# Patient Record
Sex: Male | Born: 1965 | Race: White | Hispanic: No | Marital: Married | Smoking: Never smoker
Health system: Southern US, Community
[De-identification: ages and names within clinical notes are randomized; demographics above are authoritative.]

## PROBLEM LIST (undated history)

## (undated) DIAGNOSIS — C801 Malignant (primary) neoplasm, unspecified: Secondary | ICD-10-CM

## (undated) HISTORY — PX: OTHER SURGICAL HISTORY: SHX169

---

## 2006-02-17 ENCOUNTER — Encounter: Admission: RE | Admit: 2006-02-17 | Discharge: 2006-02-17 | Payer: Self-pay | Admitting: Family Medicine

## 2006-02-19 ENCOUNTER — Ambulatory Visit (HOSPITAL_BASED_OUTPATIENT_CLINIC_OR_DEPARTMENT_OTHER): Admission: RE | Admit: 2006-02-19 | Discharge: 2006-02-19 | Payer: Self-pay | Admitting: Urology

## 2006-02-19 ENCOUNTER — Encounter (INDEPENDENT_AMBULATORY_CARE_PROVIDER_SITE_OTHER): Payer: Self-pay | Admitting: *Deleted

## 2006-03-09 ENCOUNTER — Ambulatory Visit: Payer: Self-pay | Admitting: Oncology

## 2006-04-01 LAB — CBC WITH DIFFERENTIAL/PLATELET
BASO%: 0.6 % (ref 0.0–2.0)
EOS%: 2.3 % (ref 0.0–7.0)
HCT: 48.7 % (ref 38.7–49.9)
LYMPH%: 35.8 % (ref 14.0–48.0)
MCH: 31.9 pg (ref 28.0–33.4)
MCHC: 35.8 g/dL (ref 32.0–35.9)
MONO%: 11.7 % (ref 0.0–13.0)
NEUT%: 49.6 % (ref 40.0–75.0)
Platelets: 159 10*3/uL (ref 145–400)

## 2006-04-02 ENCOUNTER — Ambulatory Visit (HOSPITAL_COMMUNITY): Admission: RE | Admit: 2006-04-02 | Discharge: 2006-04-02 | Payer: Self-pay | Admitting: Oncology

## 2006-04-04 LAB — COMPREHENSIVE METABOLIC PANEL
ALT: 34 U/L (ref 0–53)
AST: 21 U/L (ref 0–37)
Alkaline Phosphatase: 77 U/L (ref 39–117)
Creatinine, Ser: 1.22 mg/dL (ref 0.40–1.50)
Total Bilirubin: 1 mg/dL (ref 0.3–1.2)

## 2006-04-04 LAB — AFP TUMOR MARKER: AFP-Tumor Marker: 3.9 ng/mL (ref 0.0–8.0)

## 2006-04-07 ENCOUNTER — Ambulatory Visit: Admission: RE | Admit: 2006-04-07 | Discharge: 2006-04-07 | Payer: Self-pay | Admitting: Oncology

## 2006-04-14 LAB — COMPREHENSIVE METABOLIC PANEL
AST: 27 U/L (ref 0–37)
Albumin: 4.1 g/dL (ref 3.5–5.2)
Alkaline Phosphatase: 71 U/L (ref 39–117)
BUN: 11 mg/dL (ref 6–23)
Creatinine, Ser: 0.95 mg/dL (ref 0.40–1.50)
Glucose, Bld: 101 mg/dL — ABNORMAL HIGH (ref 70–99)
Total Bilirubin: 0.9 mg/dL (ref 0.3–1.2)

## 2006-04-14 LAB — CBC WITH DIFFERENTIAL/PLATELET
Basophils Absolute: 0 10*3/uL (ref 0.0–0.1)
EOS%: 2.7 % (ref 0.0–7.0)
Eosinophils Absolute: 0.1 10*3/uL (ref 0.0–0.5)
HGB: 17.1 g/dL (ref 13.0–17.1)
LYMPH%: 30 % (ref 14.0–48.0)
MCH: 31.6 pg (ref 28.0–33.4)
MCV: 87.7 fL (ref 81.6–98.0)
MONO%: 12.7 % (ref 0.0–13.0)
NEUT#: 2 10*3/uL (ref 1.5–6.5)
NEUT%: 53.8 % (ref 40.0–75.0)
Platelets: 176 10*3/uL (ref 145–400)

## 2006-04-22 LAB — CBC WITH DIFFERENTIAL/PLATELET
Basophils Absolute: 0 10*3/uL (ref 0.0–0.1)
Eosinophils Absolute: 0 10*3/uL (ref 0.0–0.5)
HCT: 44.6 % (ref 38.7–49.9)
HGB: 16.3 g/dL (ref 13.0–17.1)
MCV: 85 fL (ref 81.6–98.0)
MONO%: 0.3 % (ref 0.0–13.0)
NEUT#: 3.7 10*3/uL (ref 1.5–6.5)
NEUT%: 85.9 % — ABNORMAL HIGH (ref 40.0–75.0)
Platelets: 161 10*3/uL (ref 145–400)
RDW: 9.8 % — ABNORMAL LOW (ref 11.2–14.6)

## 2006-04-24 ENCOUNTER — Ambulatory Visit: Payer: Self-pay | Admitting: Oncology

## 2006-04-29 LAB — CBC WITH DIFFERENTIAL/PLATELET
BASO%: 1.9 % (ref 0.0–2.0)
EOS%: 2.7 % (ref 0.0–7.0)
HCT: UNDETERMINED % (ref 38.7–49.9)
HGB: 14.2 g/dL (ref 13.0–17.1)
MCH: UNDETERMINED pg (ref 28.0–33.4)
MCHC: UNDETERMINED g/dL (ref 32.0–35.9)
MONO#: 0.2 10*3/uL (ref 0.1–0.9)
NEUT%: 2.4 % — ABNORMAL LOW (ref 40.0–75.0)
RDW: UNDETERMINED % (ref 11.2–14.6)
WBC: 1 10*3/uL — ABNORMAL LOW (ref 4.0–10.0)
lymph#: 0.7 10*3/uL — ABNORMAL LOW (ref 0.9–3.3)

## 2006-05-02 LAB — CBC WITH DIFFERENTIAL/PLATELET
Basophils Absolute: 0 10*3/uL (ref 0.0–0.1)
Eosinophils Absolute: 0.1 10*3/uL (ref 0.0–0.5)
HCT: 41.1 % (ref 38.7–49.9)
HGB: 14.8 g/dL (ref 13.0–17.1)
MONO#: 1.6 10*3/uL — ABNORMAL HIGH (ref 0.1–0.9)
NEUT#: 10.8 10*3/uL — ABNORMAL HIGH (ref 1.5–6.5)
NEUT%: 80 % — ABNORMAL HIGH (ref 40.0–75.0)
RDW: 12.3 % (ref 11.2–14.6)
lymph#: 0.9 10*3/uL (ref 0.9–3.3)

## 2006-05-04 LAB — AFP TUMOR MARKER: AFP-Tumor Marker: 3.8 ng/mL (ref 0.0–8.0)

## 2006-05-04 LAB — COMPREHENSIVE METABOLIC PANEL
AST: 39 U/L — ABNORMAL HIGH (ref 0–37)
Albumin: 4 g/dL (ref 3.5–5.2)
BUN: 19 mg/dL (ref 6–23)
CO2: 25 mEq/L (ref 19–32)
Calcium: 8.9 mg/dL (ref 8.4–10.5)
Chloride: 100 mEq/L (ref 96–112)
Creatinine, Ser: 1.04 mg/dL (ref 0.40–1.50)
Glucose, Bld: 212 mg/dL — ABNORMAL HIGH (ref 70–99)
Potassium: 3.8 mEq/L (ref 3.5–5.3)

## 2006-05-04 LAB — LACTATE DEHYDROGENASE: LDH: 1113 U/L — ABNORMAL HIGH (ref 94–250)

## 2006-05-13 LAB — CBC WITH DIFFERENTIAL/PLATELET
BASO%: 0.4 % (ref 0.0–2.0)
EOS%: 0.3 % (ref 0.0–7.0)
LYMPH%: 10.4 % — ABNORMAL LOW (ref 14.0–48.0)
MCH: 30.8 pg (ref 28.0–33.4)
MCHC: 36.3 g/dL — ABNORMAL HIGH (ref 32.0–35.9)
MONO#: 0 10*3/uL — ABNORMAL LOW (ref 0.1–0.9)
MONO%: 1 % (ref 0.0–13.0)
NEUT%: 87.9 % — ABNORMAL HIGH (ref 40.0–75.0)
Platelets: 57 10*3/uL — ABNORMAL LOW (ref 145–400)
RBC: 4.37 10*6/uL (ref 4.20–5.71)
WBC: 4.8 10*3/uL (ref 4.0–10.0)

## 2006-05-20 LAB — CBC WITH DIFFERENTIAL/PLATELET
BASO%: 1.9 % (ref 0.0–2.0)
EOS%: 0.7 % (ref 0.0–7.0)
HCT: 32.6 % — ABNORMAL LOW (ref 38.7–49.9)
MCH: 30.6 pg (ref 28.0–33.4)
MCHC: 35.6 g/dL (ref 32.0–35.9)
MONO#: 0.7 10*3/uL (ref 0.1–0.9)
RBC: 3.79 10*6/uL — ABNORMAL LOW (ref 4.20–5.71)
RDW: 11.1 % — ABNORMAL LOW (ref 11.2–14.6)
WBC: 15.6 10*3/uL — ABNORMAL HIGH (ref 4.0–10.0)
lymph#: 1.2 10*3/uL (ref 0.9–3.3)

## 2006-05-23 LAB — CBC WITH DIFFERENTIAL/PLATELET
BASO%: 0.1 % (ref 0.0–2.0)
Basophils Absolute: 0 10*3/uL (ref 0.0–0.1)
EOS%: 0.6 % (ref 0.0–7.0)
HCT: 32.6 % — ABNORMAL LOW (ref 38.7–49.9)
HGB: 11.7 g/dL — ABNORMAL LOW (ref 13.0–17.1)
MCH: 31.2 pg (ref 28.0–33.4)
MONO#: 0.6 10*3/uL (ref 0.1–0.9)
NEUT%: 91.1 % — ABNORMAL HIGH (ref 40.0–75.0)
RDW: 12.2 % (ref 11.2–14.6)
WBC: 18.5 10*3/uL — ABNORMAL HIGH (ref 4.0–10.0)
lymph#: 0.9 10*3/uL (ref 0.9–3.3)

## 2006-05-25 LAB — BETA HCG QUANT (REF LAB): Beta hCG, Tumor Marker: <0.5 m[IU]/mL (ref <5.0)

## 2006-05-25 LAB — LACTATE DEHYDROGENASE: LDH: 612 U/L — ABNORMAL HIGH (ref 94–250)

## 2006-05-25 LAB — AFP TUMOR MARKER: AFP-Tumor Marker: 4.8 ng/mL (ref 0.0–8.0)

## 2006-06-03 ENCOUNTER — Emergency Department (HOSPITAL_COMMUNITY): Admission: EM | Admit: 2006-06-03 | Discharge: 2006-06-03 | Payer: Self-pay | Admitting: Emergency Medicine

## 2006-06-03 ENCOUNTER — Ambulatory Visit: Payer: Self-pay | Admitting: Oncology

## 2006-06-03 LAB — CBC WITH DIFFERENTIAL/PLATELET
Eosinophils Absolute: 0 10*3/uL (ref 0.0–0.5)
MONO#: 0 10*3/uL — ABNORMAL LOW (ref 0.1–0.9)
NEUT#: 3.1 10*3/uL (ref 1.5–6.5)
RBC: 3.8 10*6/uL — ABNORMAL LOW (ref 4.20–5.71)
RDW: 12.6 % (ref 11.2–14.6)
WBC: 3.6 10*3/uL — ABNORMAL LOW (ref 4.0–10.0)
lymph#: 0.4 10*3/uL — ABNORMAL LOW (ref 0.9–3.3)

## 2006-06-10 ENCOUNTER — Ambulatory Visit: Payer: Self-pay | Admitting: Oncology

## 2006-06-10 LAB — CBC WITH DIFFERENTIAL/PLATELET
Eosinophils Absolute: 0 10*3/uL (ref 0.0–0.5)
HCT: 27.1 % — ABNORMAL LOW (ref 38.7–49.9)
HGB: 9.9 g/dL — ABNORMAL LOW (ref 13.0–17.1)
LYMPH%: 38 % (ref 14.0–48.0)
MONO#: 0.1 10*3/uL (ref 0.1–0.9)
NEUT#: 0.4 10*3/uL — CL (ref 1.5–6.5)
NEUT%: 42.6 % (ref 40.0–75.0)
Platelets: 62 10*3/uL — ABNORMAL LOW (ref 145–400)
RBC: 3.08 10*6/uL — ABNORMAL LOW (ref 4.20–5.71)
WBC: 1 10*3/uL — ABNORMAL LOW (ref 4.0–10.0)

## 2006-06-23 ENCOUNTER — Ambulatory Visit (HOSPITAL_COMMUNITY): Admission: RE | Admit: 2006-06-23 | Discharge: 2006-06-23 | Payer: Self-pay | Admitting: Oncology

## 2006-06-27 LAB — CBC WITH DIFFERENTIAL/PLATELET
BASO%: 0.9 % (ref 0.0–2.0)
HCT: 30.5 % — ABNORMAL LOW (ref 38.7–49.9)
LYMPH%: 24.1 % (ref 14.0–48.0)
MCHC: 35 g/dL (ref 32.0–35.9)
MONO#: 0.2 10*3/uL (ref 0.1–0.9)
NEUT%: 65.7 % (ref 40.0–75.0)
Platelets: 71 10*3/uL — ABNORMAL LOW (ref 145–400)
WBC: 2.2 10*3/uL — ABNORMAL LOW (ref 4.0–10.0)

## 2006-06-29 LAB — COMPREHENSIVE METABOLIC PANEL
ALT: 53 U/L (ref 0–53)
AST: 31 U/L (ref 0–37)
Albumin: 3.7 g/dL (ref 3.5–5.2)
BUN: 13 mg/dL (ref 6–23)
Calcium: 8.4 mg/dL (ref 8.4–10.5)
Chloride: 105 mEq/L (ref 96–112)
Potassium: 4.1 mEq/L (ref 3.5–5.3)

## 2006-06-29 LAB — BETA HCG QUANT (REF LAB): Beta hCG, Tumor Marker: <0.5 m[IU]/mL (ref <5.0)

## 2006-09-17 ENCOUNTER — Ambulatory Visit: Payer: Self-pay | Admitting: Oncology

## 2006-09-19 ENCOUNTER — Ambulatory Visit (HOSPITAL_COMMUNITY): Admission: RE | Admit: 2006-09-19 | Discharge: 2006-09-19 | Payer: Self-pay | Admitting: Oncology

## 2006-09-19 LAB — CBC WITH DIFFERENTIAL/PLATELET
BASO%: 0.6 % (ref 0.0–2.0)
EOS%: 3.3 % (ref 0.0–7.0)
HCT: 40 % (ref 38.7–49.9)
LYMPH%: 35.6 % (ref 14.0–48.0)
MCH: 31.6 pg (ref 28.0–33.4)
MCHC: 35.9 g/dL (ref 32.0–35.9)
MCV: 88.2 fL (ref 81.6–98.0)
MONO%: 15.1 % — ABNORMAL HIGH (ref 0.0–13.0)
NEUT%: 45.4 % (ref 40.0–75.0)
Platelets: 122 10*3/uL — ABNORMAL LOW (ref 145–400)

## 2006-09-22 LAB — COMPREHENSIVE METABOLIC PANEL
ALT: 38 U/L (ref 0–53)
AST: 25 U/L (ref 0–37)
Creatinine, Ser: 1.12 mg/dL (ref 0.40–1.50)
Total Bilirubin: 0.5 mg/dL (ref 0.3–1.2)

## 2006-09-22 LAB — AFP TUMOR MARKER: AFP-Tumor Marker: 3.8 ng/mL (ref 0.0–8.0)

## 2006-09-22 LAB — BETA HCG QUANT (REF LAB): Beta hCG, Tumor Marker: <0.5 m[IU]/mL (ref <5.0)

## 2006-12-24 ENCOUNTER — Ambulatory Visit: Payer: Self-pay | Admitting: Oncology

## 2006-12-26 ENCOUNTER — Ambulatory Visit (HOSPITAL_COMMUNITY): Admission: RE | Admit: 2006-12-26 | Discharge: 2006-12-26 | Payer: Self-pay | Admitting: Oncology

## 2006-12-26 LAB — CBC WITH DIFFERENTIAL/PLATELET
BASO%: 0.1 % (ref 0.0–2.0)
Basophils Absolute: 0 10*3/uL (ref 0.0–0.1)
EOS%: 1.9 % (ref 0.0–7.0)
HGB: 16.2 g/dL (ref 13.0–17.1)
MCH: 31.7 pg (ref 28.0–33.4)
MONO#: 0.3 10*3/uL (ref 0.1–0.9)
RDW: 13.1 % (ref 11.2–14.6)
WBC: 2.4 10*3/uL — ABNORMAL LOW (ref 4.0–10.0)
lymph#: 0.7 10*3/uL — ABNORMAL LOW (ref 0.9–3.3)

## 2006-12-28 LAB — COMPREHENSIVE METABOLIC PANEL
ALT: 32 U/L (ref 0–53)
AST: 19 U/L (ref 0–37)
Albumin: 4.4 g/dL (ref 3.5–5.2)
Calcium: 9 mg/dL (ref 8.4–10.5)
Chloride: 105 mEq/L (ref 96–112)
Potassium: 4.4 mEq/L (ref 3.5–5.3)

## 2006-12-28 LAB — LACTATE DEHYDROGENASE: LDH: 154 U/L (ref 94–250)

## 2007-04-01 ENCOUNTER — Ambulatory Visit: Payer: Self-pay | Admitting: Oncology

## 2007-04-03 LAB — CBC WITH DIFFERENTIAL/PLATELET
BASO%: 0.2 % (ref 0.0–2.0)
Eosinophils Absolute: 0.1 10*3/uL (ref 0.0–0.5)
LYMPH%: 38 % (ref 14.0–48.0)
MCHC: 34.8 g/dL (ref 32.0–35.9)
MCV: 89 fL (ref 81.6–98.0)
MONO%: 9.8 % (ref 0.0–13.0)
Platelets: 154 10*3/uL (ref 145–400)
RBC: 5.21 10*6/uL (ref 4.20–5.71)

## 2007-04-05 LAB — COMPREHENSIVE METABOLIC PANEL
Alkaline Phosphatase: 75 U/L (ref 39–117)
Creatinine, Ser: 1.2 mg/dL (ref 0.40–1.50)
Glucose, Bld: 87 mg/dL (ref 70–99)
Sodium: 140 mEq/L (ref 135–145)
Total Bilirubin: 0.9 mg/dL (ref 0.3–1.2)
Total Protein: 6.9 g/dL (ref 6.0–8.3)

## 2007-04-05 LAB — AFP TUMOR MARKER: AFP-Tumor Marker: 2.2 ng/mL (ref 0.0–8.0)

## 2007-07-08 ENCOUNTER — Ambulatory Visit: Payer: Self-pay | Admitting: Oncology

## 2007-07-13 ENCOUNTER — Ambulatory Visit (HOSPITAL_COMMUNITY): Admission: RE | Admit: 2007-07-13 | Discharge: 2007-07-13 | Payer: Self-pay | Admitting: Oncology

## 2007-07-13 LAB — CBC WITH DIFFERENTIAL/PLATELET
BASO%: 0 % (ref 0.0–2.0)
Eosinophils Absolute: 0 10*3/uL (ref 0.0–0.5)
MCHC: 35.4 g/dL (ref 32.0–35.9)
MONO#: 0.3 10*3/uL (ref 0.1–0.9)
NEUT#: 1.5 10*3/uL (ref 1.5–6.5)
Platelets: 132 10*3/uL — ABNORMAL LOW (ref 145–400)
RBC: 5.3 10*6/uL (ref 4.20–5.71)
RDW: 12.6 % (ref 11.2–14.6)
WBC: 2.8 10*3/uL — ABNORMAL LOW (ref 4.0–10.0)
lymph#: 0.9 10*3/uL (ref 0.9–3.3)

## 2007-07-16 LAB — COMPREHENSIVE METABOLIC PANEL
ALT: 41 U/L (ref 0–53)
Albumin: 4.6 g/dL (ref 3.5–5.2)
CO2: 25 mEq/L (ref 19–32)
Chloride: 105 mEq/L (ref 96–112)
Glucose, Bld: 105 mg/dL — ABNORMAL HIGH (ref 70–99)
Potassium: 4.2 mEq/L (ref 3.5–5.3)
Sodium: 140 mEq/L (ref 135–145)
Total Protein: 6.9 g/dL (ref 6.0–8.3)

## 2007-07-16 LAB — LACTATE DEHYDROGENASE: LDH: 171 U/L (ref 94–250)

## 2007-10-13 ENCOUNTER — Ambulatory Visit: Payer: Self-pay | Admitting: Oncology

## 2007-10-15 LAB — CBC WITH DIFFERENTIAL/PLATELET
Basophils Absolute: 0 10*3/uL (ref 0.0–0.1)
HCT: 48.2 % (ref 38.7–49.9)
HGB: 16.8 g/dL (ref 13.0–17.1)
MCH: 32.3 pg (ref 28.0–33.4)
MONO#: 0.4 10*3/uL (ref 0.1–0.9)
NEUT%: 66.2 % (ref 40.0–75.0)
Platelets: 141 10*3/uL — ABNORMAL LOW (ref 145–400)
WBC: 4 10*3/uL (ref 4.0–10.0)
lymph#: 0.9 10*3/uL (ref 0.9–3.3)

## 2007-10-17 LAB — COMPREHENSIVE METABOLIC PANEL
ALT: 28 U/L (ref 0–53)
BUN: 21 mg/dL (ref 6–23)
CO2: 23 mEq/L (ref 19–32)
Calcium: 9 mg/dL (ref 8.4–10.5)
Creatinine, Ser: 1.17 mg/dL (ref 0.40–1.50)
Total Bilirubin: 0.6 mg/dL (ref 0.3–1.2)

## 2007-10-17 LAB — LACTATE DEHYDROGENASE: LDH: 169 U/L (ref 94–250)

## 2007-10-17 LAB — AFP TUMOR MARKER: AFP-Tumor Marker: 3.2 ng/mL (ref 0.0–8.0)

## 2007-10-17 LAB — BETA HCG QUANT (REF LAB): Beta hCG, Tumor Marker: <0.5 m[IU]/mL (ref <5.0)

## 2008-01-14 ENCOUNTER — Ambulatory Visit: Payer: Self-pay | Admitting: Oncology

## 2008-01-18 ENCOUNTER — Ambulatory Visit (HOSPITAL_COMMUNITY): Admission: RE | Admit: 2008-01-18 | Discharge: 2008-01-18 | Payer: Self-pay | Admitting: Oncology

## 2008-01-18 LAB — CBC WITH DIFFERENTIAL/PLATELET
BASO%: 0.5 % (ref 0.0–2.0)
HCT: 49.4 % (ref 38.7–49.9)
LYMPH%: 31.8 % (ref 14.0–48.0)
MCHC: 35.3 g/dL (ref 32.0–35.9)
MCV: 90 fL (ref 81.6–98.0)
MONO#: 0.4 10*3/uL (ref 0.1–0.9)
MONO%: 10.6 % (ref 0.0–13.0)
NEUT%: 55.2 % (ref 40.0–75.0)
Platelets: 128 10*3/uL — ABNORMAL LOW (ref 145–400)
WBC: 3.6 10*3/uL — ABNORMAL LOW (ref 4.0–10.0)

## 2008-01-18 LAB — COMPREHENSIVE METABOLIC PANEL
ALT: 45 U/L (ref 0–53)
AST: 29 U/L (ref 0–37)
Albumin: 4.2 g/dL (ref 3.5–5.2)
Alkaline Phosphatase: 69 U/L (ref 39–117)
BUN: 15 mg/dL (ref 6–23)
Potassium: 4.1 mEq/L (ref 3.5–5.3)

## 2008-01-20 LAB — BETA HCG QUANT (REF LAB): Beta hCG, Tumor Marker: <0.5 m[IU]/mL (ref <5.0)

## 2008-05-18 ENCOUNTER — Ambulatory Visit: Payer: Self-pay | Admitting: Oncology

## 2008-05-20 ENCOUNTER — Ambulatory Visit (HOSPITAL_COMMUNITY): Admission: RE | Admit: 2008-05-20 | Discharge: 2008-05-20 | Payer: Self-pay | Admitting: Oncology

## 2008-05-20 LAB — CBC WITH DIFFERENTIAL/PLATELET
BASO%: 0.4 % (ref 0.0–2.0)
EOS%: 1.8 % (ref 0.0–7.0)
MCH: 31.8 pg (ref 27.2–33.4)
MCHC: 35.2 g/dL (ref 32.0–36.0)
MONO#: 0.3 10*3/uL (ref 0.1–0.9)
RBC: 5.23 10*6/uL (ref 4.20–5.82)
RDW: 12.5 % (ref 11.0–14.6)
WBC: 3.2 10*3/uL — ABNORMAL LOW (ref 4.0–10.3)
lymph#: 0.8 10*3/uL — ABNORMAL LOW (ref 0.9–3.3)

## 2008-05-24 LAB — COMPREHENSIVE METABOLIC PANEL
ALT: 37 U/L (ref 0–53)
AST: 19 U/L (ref 0–37)
Albumin: 4.4 g/dL (ref 3.5–5.2)
CO2: 26 mEq/L (ref 19–32)
Calcium: 9.2 mg/dL (ref 8.4–10.5)
Chloride: 105 mEq/L (ref 96–112)
Creatinine, Ser: 1.11 mg/dL (ref 0.40–1.50)
Potassium: 4 mEq/L (ref 3.5–5.3)
Sodium: 138 mEq/L (ref 135–145)
Total Protein: 6.6 g/dL (ref 6.0–8.3)

## 2008-09-16 ENCOUNTER — Ambulatory Visit: Payer: Self-pay | Admitting: Oncology

## 2008-09-20 ENCOUNTER — Ambulatory Visit (HOSPITAL_COMMUNITY): Admission: RE | Admit: 2008-09-20 | Discharge: 2008-09-20 | Payer: Self-pay | Admitting: Oncology

## 2008-09-20 LAB — COMPREHENSIVE METABOLIC PANEL
Albumin: 4.3 g/dL (ref 3.5–5.2)
BUN: 12 mg/dL (ref 6–23)
Calcium: 9.2 mg/dL (ref 8.4–10.5)
Chloride: 104 mEq/L (ref 96–112)
Creatinine, Ser: 1.1 mg/dL (ref 0.40–1.50)
Glucose, Bld: 110 mg/dL — ABNORMAL HIGH (ref 70–99)
Potassium: 4.3 mEq/L (ref 3.5–5.3)

## 2008-09-20 LAB — CBC WITH DIFFERENTIAL/PLATELET
Basophils Absolute: 0 10*3/uL (ref 0.0–0.1)
EOS%: 2.6 % (ref 0.0–7.0)
Eosinophils Absolute: 0.1 10*3/uL (ref 0.0–0.5)
HCT: 48.1 % (ref 38.4–49.9)
HGB: 17.1 g/dL (ref 13.0–17.1)
MCH: 32.5 pg (ref 27.2–33.4)
MCV: 91.5 fL (ref 79.3–98.0)
MONO%: 10.9 % (ref 0.0–14.0)
NEUT#: 1.7 10*3/uL (ref 1.5–6.5)
NEUT%: 56 % (ref 39.0–75.0)
RDW: 12.7 % (ref 11.0–14.6)
lymph#: 0.9 10*3/uL (ref 0.9–3.3)

## 2008-09-22 LAB — AFP TUMOR MARKER: AFP-Tumor Marker: 4.1 ng/mL (ref 0.0–8.0)

## 2008-09-22 LAB — BETA HCG QUANT (REF LAB): Beta hCG, Tumor Marker: <0.5 m[IU]/mL (ref <5.0)

## 2009-01-13 ENCOUNTER — Ambulatory Visit: Payer: Self-pay | Admitting: Oncology

## 2009-01-17 ENCOUNTER — Ambulatory Visit (HOSPITAL_COMMUNITY): Admission: RE | Admit: 2009-01-17 | Discharge: 2009-01-17 | Payer: Self-pay | Admitting: Oncology

## 2009-01-17 LAB — CBC WITH DIFFERENTIAL/PLATELET
BASO%: 0.5 % (ref 0.0–2.0)
EOS%: 2.5 % (ref 0.0–7.0)
Eosinophils Absolute: 0.1 10*3/uL (ref 0.0–0.5)
LYMPH%: 27.2 % (ref 14.0–49.0)
MCH: 32.5 pg (ref 27.2–33.4)
MCHC: 35 g/dL (ref 32.0–36.0)
MCV: 92.9 fL (ref 79.3–98.0)
MONO%: 10.7 % (ref 0.0–14.0)
NEUT#: 1.8 10*3/uL (ref 1.5–6.5)
Platelets: 144 10*3/uL (ref 140–400)
RBC: 5.2 10*6/uL (ref 4.20–5.82)
RDW: 12.6 % (ref 11.0–14.6)

## 2009-01-19 LAB — COMPREHENSIVE METABOLIC PANEL
ALT: 38 U/L (ref 0–53)
AST: 19 U/L (ref 0–37)
Albumin: 4.7 g/dL (ref 3.5–5.2)
Alkaline Phosphatase: 66 U/L (ref 39–117)
Potassium: 4.4 mEq/L (ref 3.5–5.3)
Sodium: 139 mEq/L (ref 135–145)
Total Bilirubin: 0.8 mg/dL (ref 0.3–1.2)
Total Protein: 7 g/dL (ref 6.0–8.3)

## 2009-01-19 LAB — AFP TUMOR MARKER: AFP-Tumor Marker: 4.5 ng/mL (ref 0.0–8.0)

## 2009-05-09 ENCOUNTER — Ambulatory Visit: Payer: Self-pay | Admitting: Oncology

## 2009-05-11 ENCOUNTER — Ambulatory Visit (HOSPITAL_COMMUNITY): Admission: RE | Admit: 2009-05-11 | Discharge: 2009-05-11 | Payer: Self-pay | Admitting: Oncology

## 2009-05-11 LAB — CBC WITH DIFFERENTIAL/PLATELET
Basophils Absolute: 0 10*3/uL (ref 0.0–0.1)
EOS%: 1.9 % (ref 0.0–7.0)
HCT: 49.2 % (ref 38.4–49.9)
HGB: 17.4 g/dL — ABNORMAL HIGH (ref 13.0–17.1)
LYMPH%: 23.7 % (ref 14.0–49.0)
MCH: 33 pg (ref 27.2–33.4)
MCV: 93.4 fL (ref 79.3–98.0)
MONO%: 8.9 % (ref 0.0–14.0)
NEUT%: 65.1 % (ref 39.0–75.0)
RDW: 13.1 % (ref 11.0–14.6)

## 2009-05-15 LAB — COMPREHENSIVE METABOLIC PANEL
AST: 26 U/L (ref 0–37)
Alkaline Phosphatase: 62 U/L (ref 39–117)
BUN: 20 mg/dL (ref 6–23)
Creatinine, Ser: 1.12 mg/dL (ref 0.40–1.50)
Total Bilirubin: 0.8 mg/dL (ref 0.3–1.2)

## 2009-05-15 LAB — BETA HCG QUANT (REF LAB): Beta hCG, Tumor Marker: <0.5 m[IU]/mL (ref <5.0)

## 2009-11-07 ENCOUNTER — Ambulatory Visit: Payer: Self-pay | Admitting: Oncology

## 2009-11-09 ENCOUNTER — Ambulatory Visit (HOSPITAL_COMMUNITY): Admission: RE | Admit: 2009-11-09 | Discharge: 2009-11-09 | Payer: Self-pay | Admitting: Oncology

## 2009-11-09 LAB — CBC WITH DIFFERENTIAL/PLATELET
BASO%: 0.4 % (ref 0.0–2.0)
EOS%: 2.5 % (ref 0.0–7.0)
HCT: 45.7 % (ref 38.4–49.9)
MCH: 32.4 pg (ref 27.2–33.4)
MCHC: 35.3 g/dL (ref 32.0–36.0)
NEUT%: 60.4 % (ref 39.0–75.0)
RBC: 4.98 10*6/uL (ref 4.20–5.82)
RDW: 12.6 % (ref 11.0–14.6)
lymph#: 0.8 10*3/uL — ABNORMAL LOW (ref 0.9–3.3)

## 2009-11-09 LAB — COMPREHENSIVE METABOLIC PANEL
ALT: 42 U/L (ref 0–53)
AST: 20 U/L (ref 0–37)
Calcium: 8.9 mg/dL (ref 8.4–10.5)
Chloride: 106 mEq/L (ref 96–112)
Creatinine, Ser: 1.19 mg/dL (ref 0.40–1.50)

## 2010-03-10 IMAGING — CR DG CHEST 2V
2 series · 2 of 2 positions shown · non-contrast
Comparison: 01/17/2009

CLINICAL DATA: History of testicular carcinoma.

CHEST - 2 VIEW

[w chest pa]
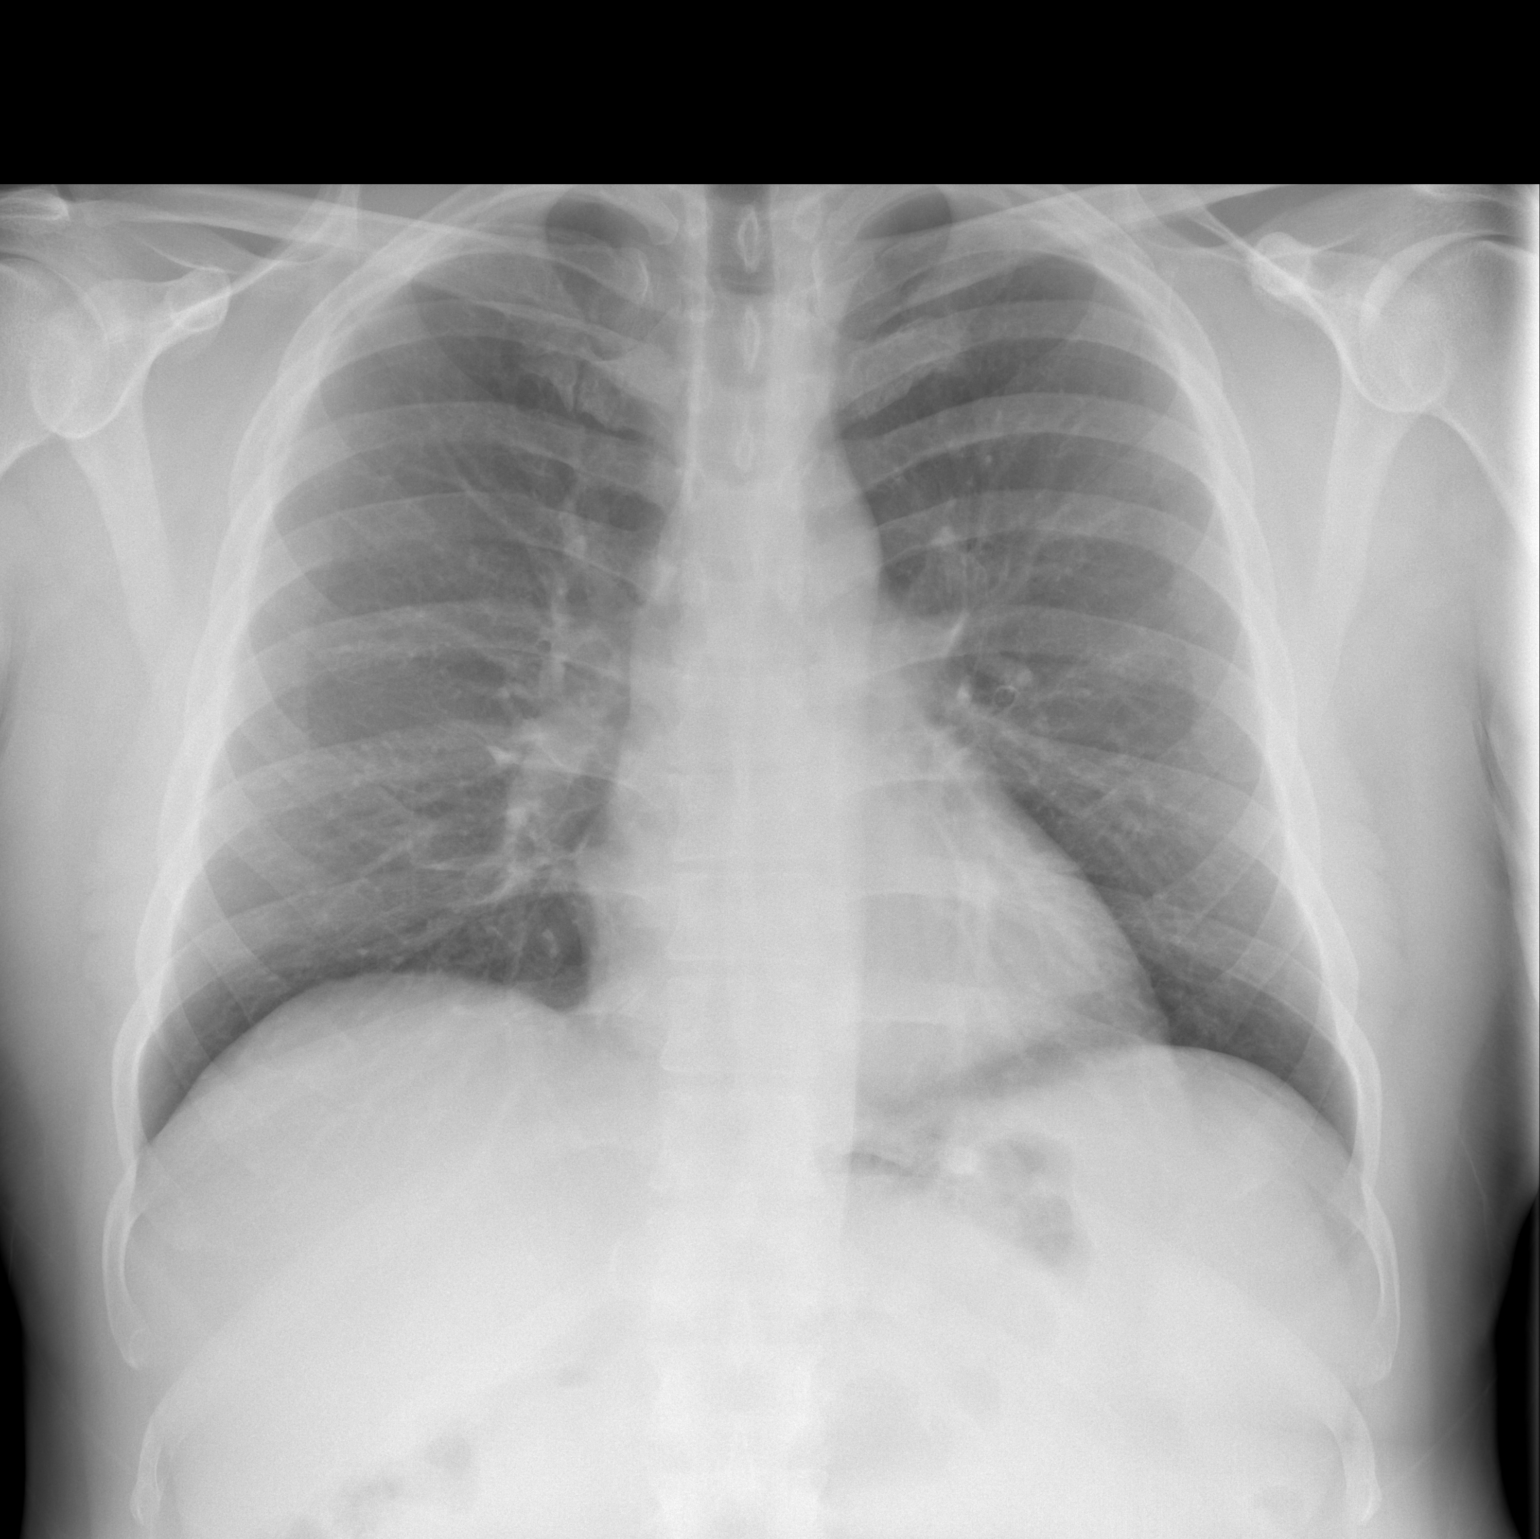

[w chest lat]
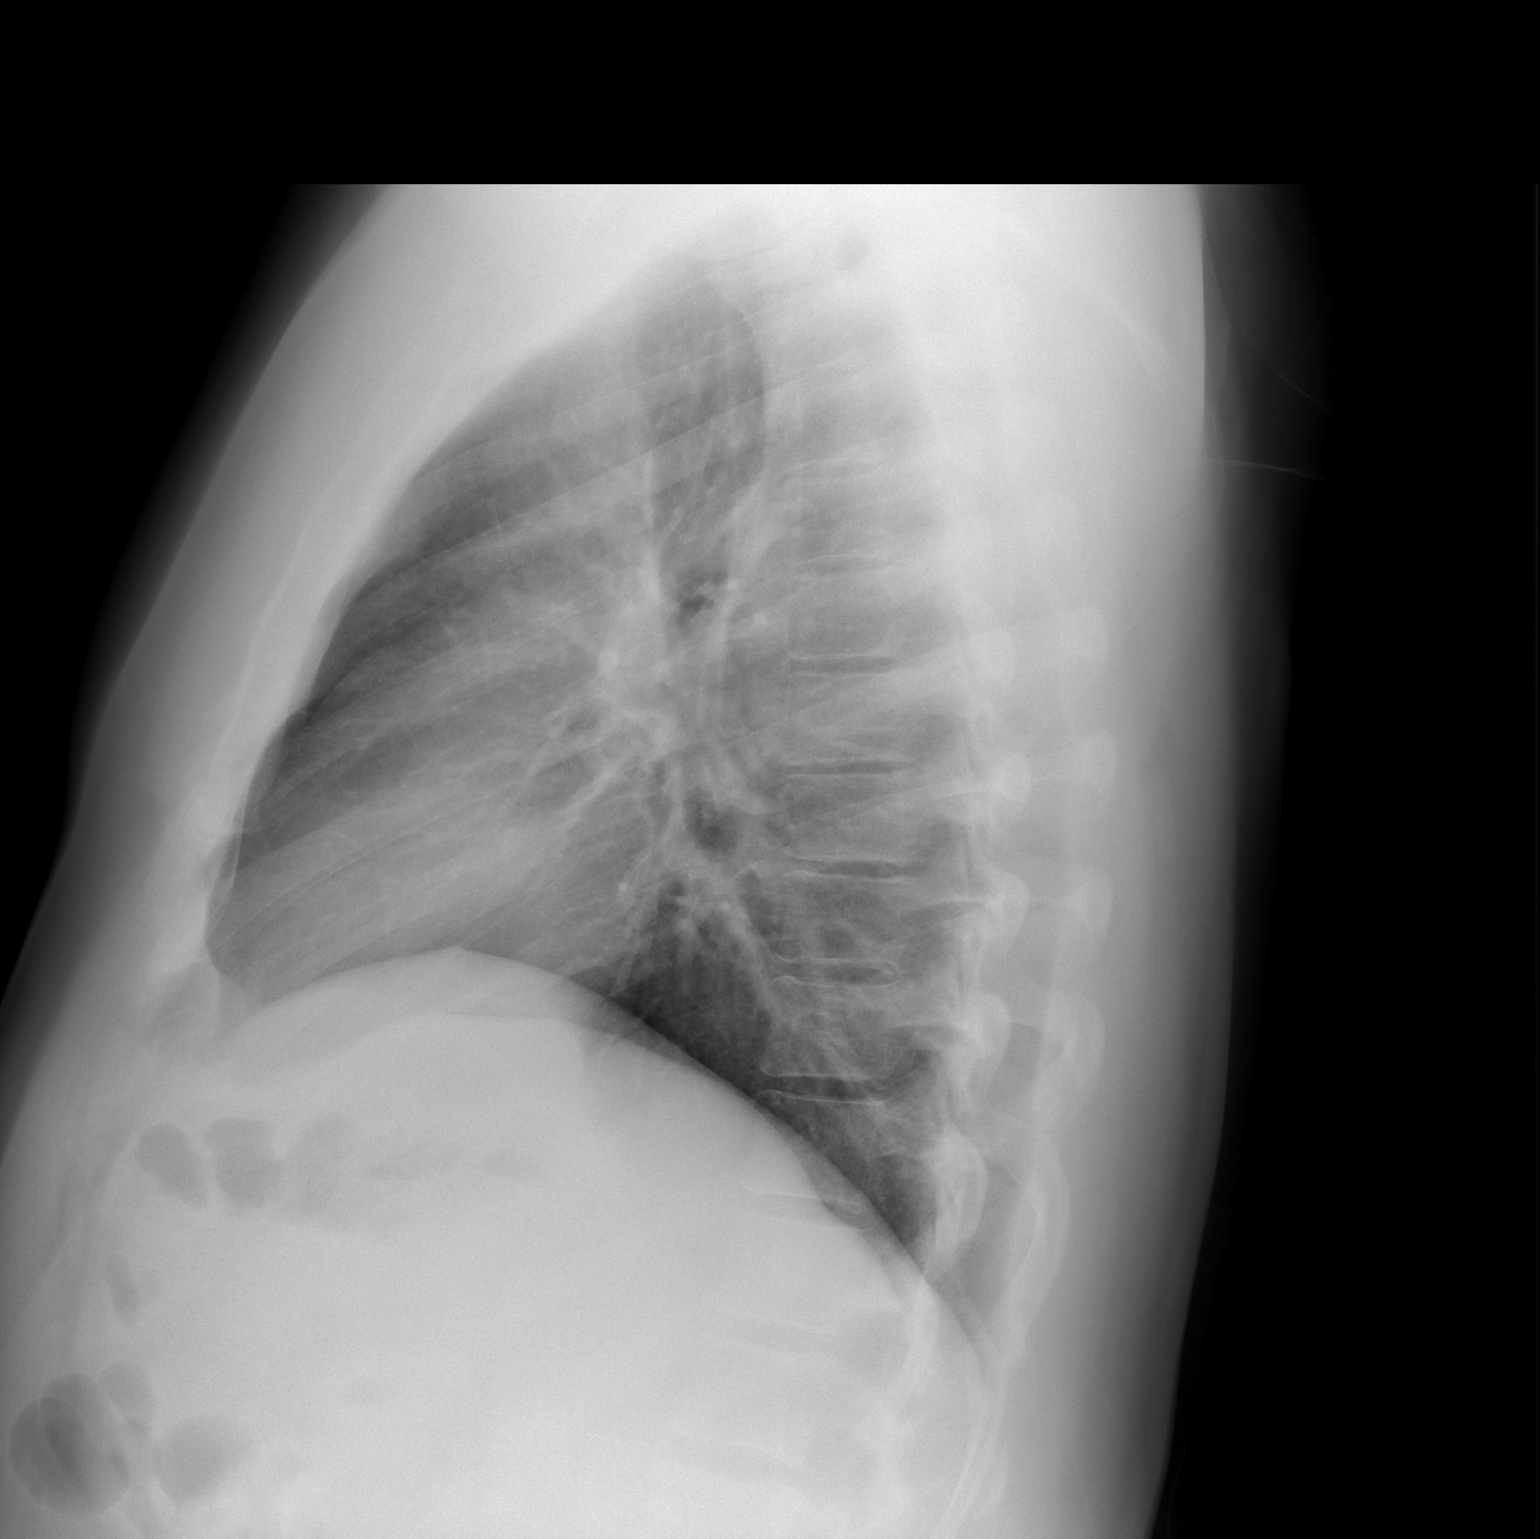

[2 of 2 positions shown; findings below may reference images not displayed]

FINDINGS: The cardiac silhouette is normal size and shape. The
lungs are well aerated and free of infiltrates. No pleural
abnormality is evident. Bones appear average for age.
IMPRESSION: No cardiopulmonary disease evident.  Stable appearance of chest.

## 2010-05-17 ENCOUNTER — Other Ambulatory Visit: Payer: Self-pay | Admitting: Oncology

## 2010-05-17 ENCOUNTER — Encounter (HOSPITAL_BASED_OUTPATIENT_CLINIC_OR_DEPARTMENT_OTHER): Payer: Self-pay | Admitting: Oncology

## 2010-05-17 DIAGNOSIS — C6290 Malignant neoplasm of unspecified testis, unspecified whether descended or undescended: Secondary | ICD-10-CM

## 2010-05-17 LAB — CBC WITH DIFFERENTIAL/PLATELET
BASO%: 0.5 % (ref 0.0–2.0)
Basophils Absolute: 0 10*3/uL (ref 0.0–0.1)
HCT: 46.1 % (ref 38.4–49.9)
HGB: 16.4 g/dL (ref 13.0–17.1)
MCHC: 35.5 g/dL (ref 32.0–36.0)
MONO#: 0.3 10*3/uL (ref 0.1–0.9)
NEUT%: 59.7 % (ref 39.0–75.0)
RDW: 12.6 % (ref 11.0–14.6)
WBC: 2.9 10*3/uL — ABNORMAL LOW (ref 4.0–10.3)
lymph#: 0.8 10*3/uL — ABNORMAL LOW (ref 0.9–3.3)

## 2010-05-20 LAB — COMPREHENSIVE METABOLIC PANEL
ALT: 47 U/L (ref 0–53)
Albumin: 4.2 g/dL (ref 3.5–5.2)
CO2: 25 mEq/L (ref 19–32)
Calcium: 9 mg/dL (ref 8.4–10.5)
Chloride: 105 mEq/L (ref 96–112)
Creatinine, Ser: 1.17 mg/dL (ref 0.40–1.50)
Potassium: 4.2 mEq/L (ref 3.5–5.3)

## 2010-05-20 LAB — AFP TUMOR MARKER: AFP-Tumor Marker: 3.4 ng/mL (ref 0.0–8.0)

## 2010-07-27 NOTE — Op Note (Signed)
NAME:  Barry Mercado, SZAFRANSKI NO.:  [id number]   MEDICAL RECORD NO.:  [id number]          PATIENT TYPE:  AMB   LOCATION:  [facility]                         FACILITY:  [facility]   PHYSICIAN:  Courtney Paris, M.D.DATE OF BIRTH:  Jul 29, 1965   DATE OF PROCEDURE:  02/19/2006  DATE OF DISCHARGE:                               OPERATIVE REPORT   SERVICE:  Urology.   PREOPERATIVE DIAGNOSES:  1. Left testicular mass.  2. Desires sterility.   POSTOPERATIVE DIAGNOSES:  1. Left testicular mass.  2. Desires sterility.   PROCEDURE:  1. Left radical orchiectomy.  2. Right vasectomy.   SURGEON:  Courtney Paris, M.D.   ASSISTANT:  Terie Purser, M.D.   ANESTHESIA:  General.   COMPLICATIONS:  None.   SPECIMENS:  Left testicle for analysis, right vas.   DISPOSITION:  Stable, to postanesthesia care unit.   INDICATIONS:  [name] is a 45 year old male recently found to have  a solid mass involving the left testicle.  Ultrasound confirmed the  presence of a mass.  He also desires sterility.  He was counseled  regarding surgery including left radical orchiectomy and right  vasectomy.  Benefits and risks were explained and his consent was  obtained.   DESCRIPTION OF PROCEDURE:  The patient was brought to the operating room  and properly identified.  A timeout was performed to confirm the correct  patient, procedure and side.  He was administered general anesthesia,  given a preoperative antibiotic and then placed in the supine position  and prepped and draped in sterile fashion.   We began by performing the vasectomy on the right.  The vas deferens was  located in the scrotum and brought to the level of the skin.  A skin  incision was made over the vas and sharp dissection was carried down  over the vas and this was grasped with the loop forceps.  The vas was  brought into the field and cleared of its surrounding attachments.  It  was doubly clamped and divided.  The  ends were cauterized.  The ends  were then ligated with a 2-0 chromic suture.  This was then returned to  its position in the scrotum and the skin was closed with an interrupted  suture.   We then proceeded with left inguinal orchiectomy.  An approximately 4-  to 5-cm incision was made in the left inguinal region.  Dissection was  carried down through Scarpa's fascia to expose the external oblique.  This was sharply incised.  The nerve was identified.  The fascia was  opened to the level of the ring as well as proximally.  The cord was  mobilized, isolated over a right angle and then brought into the  operative field.  A Penrose drain was placed around the cord and clamped  to prevent distal migration.  The testicles was then delivered from the  scrotum into the operative field.  Cremasteric and gubernacular  attachments were taken down with the Bovie.  Once the cord was  officially immobilized, this was taken back to near the internal ring.  The cord was then clamped again over a Kelly clamp.  The cord was  divided into 2 sections and then individually suture-ligated with a 0  Vicryl suture and then a 0 silk tie.  The testicle was excised and sent  to Pathology.  Hemostasis was excellent.  The 0 silk suture ties were  left long to facilitate future identification of the spermatic cord.  We  then proceeded to close the fascia; this was done in an interrupted  fashion with 3-0 silk suture.  A Vicryl was used to close the Scarpa's  fascia.  The wound was copiously irrigated.  The skin was closed in a  running fashion with 4-0 Monocryl suture.  A sterile dressing including  Steri-Strips was applied.  There were no complications.  Please note  that Dr. Aldean Ast was present and participated in all aspects of this  procedure.  The patient was awoken from anesthesia and transported to  the recovery room in stable condition.     ______________________________  Terie Purser, MD       Courtney Paris, M.D.  Electronically Signed    JH/MEDQ  D:  02/20/2006  T:  02/21/2006  Job:  [id number]

## 2010-11-20 ENCOUNTER — Encounter (HOSPITAL_BASED_OUTPATIENT_CLINIC_OR_DEPARTMENT_OTHER): Payer: BC Managed Care – PPO | Admitting: Oncology

## 2010-11-20 ENCOUNTER — Other Ambulatory Visit: Payer: Self-pay | Admitting: Oncology

## 2010-11-20 ENCOUNTER — Ambulatory Visit (HOSPITAL_COMMUNITY)
Admission: RE | Admit: 2010-11-20 | Discharge: 2010-11-20 | Disposition: A | Discharge status: Home / Self Care | Payer: BC Managed Care – PPO | Source: Ambulatory Visit | Attending: Oncology | Admitting: Oncology

## 2010-11-20 DIAGNOSIS — C6290 Malignant neoplasm of unspecified testis, unspecified whether descended or undescended: Secondary | ICD-10-CM | POA: Insufficient documentation

## 2010-11-20 DIAGNOSIS — C801 Malignant (primary) neoplasm, unspecified: Secondary | ICD-10-CM

## 2010-11-20 LAB — CBC WITH DIFFERENTIAL/PLATELET
Basophils Absolute: 0 10*3/uL (ref 0.0–0.1)
EOS%: 0.1 % (ref 0.0–7.0)
MCH: 32.5 pg (ref 27.2–33.4)
MCV: 93.3 fL (ref 79.3–98.0)
MONO%: 2.8 % (ref 0.0–14.0)
RBC: 5.6 10*6/uL (ref 4.20–5.82)
RDW: 12.8 % (ref 11.0–14.6)

## 2010-11-23 LAB — COMPREHENSIVE METABOLIC PANEL
ALT: 40 U/L (ref 0–53)
AST: 19 U/L (ref 0–37)
Alkaline Phosphatase: 66 U/L (ref 39–117)
BUN: 25 mg/dL — ABNORMAL HIGH (ref 6–23)
Creatinine, Ser: 1.24 mg/dL (ref 0.50–1.35)
Total Bilirubin: 0.8 mg/dL (ref 0.3–1.2)

## 2010-11-23 LAB — BETA HCG QUANT (REF LAB): Beta hCG, Tumor Marker: <0.5 m[IU]/mL (ref <5.0)

## 2010-11-28 ENCOUNTER — Ambulatory Visit (HOSPITAL_COMMUNITY)
Admission: RE | Admit: 2010-11-28 | Discharge: 2010-11-28 | Disposition: A | Discharge status: Home / Self Care | Payer: BC Managed Care – PPO | Source: Ambulatory Visit | Attending: Oncology | Admitting: Oncology

## 2010-11-28 ENCOUNTER — Encounter (HOSPITAL_COMMUNITY): Payer: Self-pay

## 2010-11-28 DIAGNOSIS — C6290 Malignant neoplasm of unspecified testis, unspecified whether descended or undescended: Secondary | ICD-10-CM

## 2010-11-28 DIAGNOSIS — K8020 Calculus of gallbladder without cholecystitis without obstruction: Secondary | ICD-10-CM | POA: Insufficient documentation

## 2010-11-28 DIAGNOSIS — K7689 Other specified diseases of liver: Secondary | ICD-10-CM | POA: Insufficient documentation

## 2010-11-28 HISTORY — DX: Malignant (primary) neoplasm, unspecified: C80.1

## 2010-11-28 MED ORDER — IOHEXOL 300 MG/ML  SOLN: 100.0000 mL | Freq: Once | INTRAMUSCULAR | Status: AC | PRN

## 2011-04-06 ENCOUNTER — Telehealth: Payer: Self-pay | Admitting: Oncology

## 2011-04-06 NOTE — Telephone Encounter (Signed)
l/m with 4/5 appts info and mailed  aom °

## 2011-05-22 ENCOUNTER — Telehealth: Payer: Self-pay | Admitting: Oncology

## 2011-05-22 ENCOUNTER — Other Ambulatory Visit (HOSPITAL_BASED_OUTPATIENT_CLINIC_OR_DEPARTMENT_OTHER): Payer: BC Managed Care – PPO | Admitting: Lab

## 2011-05-22 ENCOUNTER — Ambulatory Visit (HOSPITAL_BASED_OUTPATIENT_CLINIC_OR_DEPARTMENT_OTHER): Payer: BC Managed Care – PPO | Admitting: Oncology

## 2011-05-22 VITALS — BP 116/85 | HR 78 | Temp 97.3°F | Ht 71.0 in | Wt 251.3 lb

## 2011-05-22 DIAGNOSIS — C6290 Malignant neoplasm of unspecified testis, unspecified whether descended or undescended: Secondary | ICD-10-CM

## 2011-05-22 DIAGNOSIS — Z8547 Personal history of malignant neoplasm of testis: Secondary | ICD-10-CM

## 2011-05-22 LAB — CBC WITH DIFFERENTIAL/PLATELET
Eosinophils Absolute: 0.1 10*3/uL (ref 0.0–0.5)
HCT: 48.5 % (ref 38.4–49.9)
LYMPH%: 29.4 % (ref 14.0–49.0)
MCV: 91.4 fL (ref 79.3–98.0)
MONO#: 0.3 10*3/uL (ref 0.1–0.9)
MONO%: 11.2 % (ref 0.0–14.0)
NEUT#: 1.7 10*3/uL (ref 1.5–6.5)
NEUT%: 56.2 % (ref 39.0–75.0)
Platelets: 138 10*3/uL — ABNORMAL LOW (ref 140–400)
WBC: 3.1 10*3/uL — ABNORMAL LOW (ref 4.0–10.3)

## 2011-05-22 NOTE — Progress Notes (Signed)
Hematology and Oncology Follow Up Visit  Barry Mercado [id number] 09-14-1965 46 y.o. 05/22/2011 9:16 AM  CC: Barry Mercado, M.D.    Principle Diagnosis: A 46 year old gentleman diagnosed with stage IB nonseminomatous testis cancer diagnosed in 2007.   Prior Therapy: 1. Underwent a radical orchiectomy.  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  He continues to have persistent elevation in his tumor markers.  2. He received 3 cycles of BEP chemotherapy with normalization of his tumor marker.  Therapy ended in April 2008.  Current therapy: Observation and surveillance  Interim History:  Barry Mercado presents today for a followup visit.  He is a pleasant gentleman with a testes cancer as outlined above.  He has been in remission and continues to do very well at this point.  He reports no symptoms at this point. His abdominal pain is a lot better and since has resolved. He has no new complaints. He reports no masses or lesion in the testis on self examinations.  Apatite and energy are excellent.    Medications: I have reviewed the patient's current medications. Current outpatient prescriptions:ibuprofen (ADVIL,MOTRIN) 200 MG tablet, Take 200 mg by mouth every 6 (six) hours as needed., Disp: , Rfl:   Allergies:  Allergies  Allergen Reactions  . Penicillins     Past Medical History, Surgical history, Social history, and Family History were reviewed and updated.  Review of Systems: Constitutional:  Negative for fever, chills, night sweats, anorexia, weight loss, pain. Cardiovascular: no chest pain or dyspnea on exertion Respiratory: no cough, shortness of breath, or wheezing Neurological: no TIA or stroke symptoms Dermatological: negative ENT: negative Skin: Negative. Gastrointestinal: no abdominal pain, change in bowel habits, or black or bloody stools Genito-Urinary: no dysuria, trouble voiding, or hematuria Hematological and  Lymphatic: negative Breast: negative Musculoskeletal: negative Remaining ROS negative. Physical Exam: Blood pressure 116/85, pulse 78, temperature 97.3 F (36.3 C), temperature source Oral, height 5\' 11"  (1.803 m), weight 251 lb 4.8 oz (113.989 kg). ECOG: 0 General appearance: alert Head: Normocephalic, without obvious abnormality, atraumatic Neck: no adenopathy, no carotid bruit, no JVD, supple, symmetrical, trachea midline and thyroid not enlarged, symmetric, no tenderness/mass/nodules Lymph nodes: Cervical, supraclavicular, and axillary nodes normal. Heart:regular rate and rhythm, S1, S2 normal, no murmur, click, rub or gallop Lung:chest clear, no wheezing, rales, normal symmetric air entry Abdomin: soft, non-tender, without masses or organomegaly EXT:no erythema, induration, or nodules   Lab Results: Lab Results  Component Value Date   WBC 3.1* 05/22/2011   HGB 16.9 05/22/2011   HCT 48.5 05/22/2011   MCV 91.4 05/22/2011   PLT 138* 05/22/2011     Chemistry      Component Value Date/Time   NA 138 11/20/2010 1330   NA 138 11/20/2010 1330   NA 138 11/20/2010 1330   K 4.4 11/20/2010 1330   K 4.4 11/20/2010 1330   K 4.4 11/20/2010 1330   CL 100 11/20/2010 1330   CL 100 11/20/2010 1330   CL 100 11/20/2010 1330   CO2 26 11/20/2010 1330   CO2 26 11/20/2010 1330   CO2 26 11/20/2010 1330   BUN 25* 11/20/2010 1330   BUN 25* 11/20/2010 1330   BUN 25* 11/20/2010 1330   CREATININE 1.24 11/20/2010 1330   CREATININE 1.24 11/20/2010 1330   CREATININE 1.24 11/20/2010 1330      Component Value Date/Time   CALCIUM 9.6 11/20/2010 1330   CALCIUM 9.6 11/20/2010 1330   CALCIUM 9.6 11/20/2010 1330  ALKPHOS 66 11/20/2010 1330   ALKPHOS 66 11/20/2010 1330   ALKPHOS 66 11/20/2010 1330   AST 19 11/20/2010 1330   AST 19 11/20/2010 1330   AST 19 11/20/2010 1330   ALT 40 11/20/2010 1330   ALT 40 11/20/2010 1330   ALT 40 11/20/2010 1330   BILITOT 0.8 11/20/2010 1330   BILITOT 0.8 11/20/2010 1330   BILITOT 0.8  11/20/2010 1330         Impression and Plan:  This is a pleasant 46 year old gentleman with the following issues:  1. Stage IB nonseminomatous testis cancer.  He is now 5 years removed from the  completion of chemotherapy without any evidence to suggest recurrent disease. The plan is to continue follow up every 12 months with an exam, tumor markers and chest xray.   2. Erythrocytosis.  He has gained some weight and he might have developed some symptoms of obstructive sleep apnea. It is better now   3.    Followup will be in 12 months' time.   [facility], MD 3/13/20139:16 AM

## 2011-05-22 NOTE — Telephone Encounter (Signed)
appt made and printed for pt ans pt aware to get cxr prior to appt aom

## 2011-05-26 LAB — AFP TUMOR MARKER: AFP-Tumor Marker: 2.9 ng/mL (ref 0.0–8.0)

## 2011-05-26 LAB — COMPREHENSIVE METABOLIC PANEL
Alkaline Phosphatase: 63 U/L (ref 39–117)
BUN: 17 mg/dL (ref 6–23)
CO2: 28 mEq/L (ref 19–32)
Creatinine, Ser: 1.15 mg/dL (ref 0.50–1.35)
Glucose, Bld: 110 mg/dL — ABNORMAL HIGH (ref 70–99)
Total Bilirubin: 0.9 mg/dL (ref 0.3–1.2)

## 2011-05-26 LAB — BETA HCG QUANT (REF LAB): Beta hCG, Tumor Marker: <0.5 m[IU]/mL (ref <5.0)

## 2011-05-26 LAB — LACTATE DEHYDROGENASE: LDH: 194 U/L (ref 94–250)

## 2012-03-18 ENCOUNTER — Emergency Department (HOSPITAL_COMMUNITY)
Admission: EM | Admit: 2012-03-18 | Discharge: 2012-03-18 | Disposition: A | Discharge status: Home / Self Care | Payer: BC Managed Care – PPO | Attending: Emergency Medicine | Admitting: Emergency Medicine

## 2012-03-18 ENCOUNTER — Emergency Department (HOSPITAL_COMMUNITY): Payer: BC Managed Care – PPO

## 2012-03-18 ENCOUNTER — Encounter (HOSPITAL_COMMUNITY): Payer: Self-pay | Admitting: Emergency Medicine

## 2012-03-18 DIAGNOSIS — R109 Unspecified abdominal pain: Secondary | ICD-10-CM

## 2012-03-18 DIAGNOSIS — Z8547 Personal history of malignant neoplasm of testis: Secondary | ICD-10-CM | POA: Insufficient documentation

## 2012-03-18 DIAGNOSIS — R1012 Left upper quadrant pain: Secondary | ICD-10-CM | POA: Insufficient documentation

## 2012-03-18 LAB — CBC WITH DIFFERENTIAL/PLATELET
Eosinophils Absolute: 0 10*3/uL (ref 0.0–0.7)
Lymphs Abs: 1 10*3/uL (ref 0.7–4.0)
MCH: 32.4 pg (ref 26.0–34.0)
Neutro Abs: 5.7 10*3/uL (ref 1.7–7.7)
Neutrophils Relative %: 79 % — ABNORMAL HIGH (ref 43–77)
Platelets: 162 10*3/uL (ref 150–400)
RBC: 5.55 MIL/uL (ref 4.22–5.81)
WBC: 7.3 10*3/uL (ref 4.0–10.5)

## 2012-03-18 LAB — URINALYSIS, ROUTINE W REFLEX MICROSCOPIC
Bilirubin Urine: NEGATIVE
Hgb urine dipstick: NEGATIVE
Specific Gravity, Urine: 1.025 (ref 1.005–1.030)
pH: 7 (ref 5.0–8.0)

## 2012-03-18 LAB — COMPREHENSIVE METABOLIC PANEL
ALT: 62 U/L — ABNORMAL HIGH (ref 0–53)
Albumin: 4.5 g/dL (ref 3.5–5.2)
Alkaline Phosphatase: 71 U/L (ref 39–117)
Chloride: 102 mEq/L (ref 96–112)
GFR calc Af Amer: >90 mL/min (ref >90)
Glucose, Bld: 127 mg/dL — ABNORMAL HIGH (ref 70–99)
Potassium: 4.3 mEq/L (ref 3.5–5.1)
Sodium: 140 mEq/L (ref 135–145)
Total Protein: 7.5 g/dL (ref 6.0–8.3)

## 2012-03-18 LAB — ETHANOL: Alcohol, Ethyl (B): <11 mg/dL (ref 0–11)

## 2012-03-18 MED ORDER — SODIUM CHLORIDE 0.9 % IV SOLN
INTRAVENOUS | Status: DC
  Administered 2012-03-18: 14:00:00 via INTRAVENOUS

## 2012-03-18 MED ORDER — SODIUM CHLORIDE 0.9 % IV BOLUS (SEPSIS)
1000.0000 mL | Freq: Once | INTRAVENOUS | Status: AC
  Administered 2012-03-18: 1000 mL via INTRAVENOUS

## 2012-03-18 NOTE — ED Notes (Signed)
States that he had sudden onset of left upper quadrant at 0400 that has been intermittent. States that he was sent here by urgent care.

## 2012-03-18 NOTE — ED Notes (Signed)
Patient given urinal and advised need sample.  

## 2012-03-18 NOTE — ED Notes (Signed)
MD at bedside. 

## 2012-03-18 NOTE — ED Provider Notes (Signed)
History     CSN: [id number]  Arrival date & time 03/18/12  1051   First MD Initiated Contact with Patient 03/18/12 1141      Chief Complaint  Patient presents with  . Abdominal Pain    (Consider location/radiation/quality/duration/timing/severity/associated sxs/prior treatment) HPI Comments: Barry Mercado is a 47 y.o. Male who had sudden onset of left upper abdominal pain, at 4 AM this morning. It has persisted, waxing and waning. Currently, the pain is 3/10. It is a 9/10, at worst. There is no associated nausea, or vomiting. He last had a bowel movement yesterday. He had several loose, brown-colored stools yesterday. He denies fever, chills, cough, shortness of breath, chest pain, weakness, or dizziness. He was seen briefly at an urgent care center, and sent here for further evaluation. He has similar episode several years ago that resolved spontaneously without any diagnosis, when he went to see a doctor, about it. There are no known modifying factors. He tried taking Gas-X and an antacid, without relief.  Patient is a 47 y.o. male presenting with abdominal pain. The history is provided by the patient.  Abdominal Pain The primary symptoms of the illness include abdominal pain.    Past Medical History  Diagnosis Date  . Cancer     testicular ca    History reviewed. No pertinent past surgical history.  No family history on file.  History  Substance Use Topics  . Smoking status: Never Smoker   . Smokeless tobacco: Not on file  . Alcohol Use: No      Review of Systems  Gastrointestinal: Positive for abdominal pain.  All other systems reviewed and are negative.    Allergies  Penicillins  Home Medications   Current Outpatient Rx  Name  Route  Sig  Dispense  Refill  . IBUPROFEN 200 MG PO TABS   Oral   Take 200 mg by mouth every 6 (six) hours as needed.           BP 128/74  Pulse 86  Temp 98.8 F (37.1 C)  Resp 16  SpO2 97%  Physical Exam  Nursing note  and vitals reviewed. Constitutional: He is oriented to person, place, and time. He appears well-developed and well-nourished.  HENT:  Head: Normocephalic and atraumatic.  Right Ear: External ear normal.  Left Ear: External ear normal.  Eyes: Conjunctivae normal and EOM are normal. Pupils are equal, round, and reactive to light.  Neck: Normal range of motion and phonation normal. Neck supple.  Cardiovascular: Normal rate, regular rhythm, normal heart sounds and intact distal pulses.   Pulmonary/Chest: Effort normal and breath sounds normal. He exhibits no bony tenderness.  Abdominal: Soft. Normal appearance. He exhibits no distension and no mass. There is no tenderness. There is no guarding.       No palpable abdominal tenderness  Musculoskeletal: Normal range of motion.  Neurological: He is alert and oriented to person, place, and time. He has normal strength. No cranial nerve deficit or sensory deficit. He exhibits normal muscle tone. Coordination normal.  Skin: Skin is warm, dry and intact.  Psychiatric: He has a normal mood and affect. His behavior is normal. Judgment and thought content normal.    ED Course  Procedures (including critical care time)    Reevaluation discharge: He remains comfortable. The pain is almost gone. He, states that currently, it is, "just like a toothache." He does not desire pain medicine at this time.   Labs Reviewed  CBC WITH DIFFERENTIAL -  Abnormal; Notable for the following:    Hemoglobin 18.0 (*)     MCHC 36.7 (*)     Neutrophils Relative 79 (*)     All other components within normal limits  COMPREHENSIVE METABOLIC PANEL - Abnormal; Notable for the following:    Glucose, Bld 127 (*)     ALT 62 (*)     GFR calc non Af Amer 84 (*)     All other components within normal limits  URINALYSIS, ROUTINE W REFLEX MICROSCOPIC - Abnormal; Notable for the following:    APPearance CLOUDY (*)     All other components within normal limits  LIPASE, BLOOD    URINE RAPID DRUG SCREEN (HOSP PERFORMED)  ETHANOL  URINE CULTURE   Dg Abd Acute W/chest  03/18/2012  *RADIOLOGY REPORT*  Clinical Data: Abdominal pain, nausea.  ACUTE ABDOMEN SERIES (ABDOMEN 2 VIEW & CHEST 1 VIEW)  Comparison: None.  Findings: Cardiomediastinal silhouette appears normal.  No acute pulmonary disease is noted.  Bony thorax is intact.  No pneumoperitoneum is noted.  No abnormal bowel gas pattern is noted.  IMPRESSION: No evidence of bowel obstruction or ileus.   Original Report Authenticated By: Lupita Raider.,  M.D.    Nursing notes, applicable records and vitals reviewed.  Radiologic Images/Reports reviewed.   1. Abdominal pain       MDM  Nonspecific abdominal pain, spontaneously improved. Doubt colitis, UTI, constipation, complications from prior chest or cancer. Patient stable for discharge with outpatient management. Patient is comfortable going home and following up with his PCP in 2 weeks. He has an upcoming appointment with his oncologist in 4 months.   Plan: Home Medications- Tylenol prn; Home Treatments- Rest; Recommended follow up- PCP prn       Flint Melter, MD 03/18/12 1439

## 2012-03-19 LAB — URINE CULTURE: Culture: NO GROWTH

## 2012-05-21 ENCOUNTER — Ambulatory Visit (HOSPITAL_COMMUNITY)
Admission: RE | Admit: 2012-05-21 | Discharge: 2012-05-21 | Disposition: A | Discharge status: Home / Self Care | Payer: BC Managed Care – PPO | Source: Ambulatory Visit | Attending: Oncology | Admitting: Oncology

## 2012-05-21 ENCOUNTER — Ambulatory Visit (HOSPITAL_BASED_OUTPATIENT_CLINIC_OR_DEPARTMENT_OTHER): Payer: BC Managed Care – PPO | Admitting: Oncology

## 2012-05-21 ENCOUNTER — Telehealth: Payer: Self-pay | Admitting: Oncology

## 2012-05-21 ENCOUNTER — Other Ambulatory Visit (HOSPITAL_BASED_OUTPATIENT_CLINIC_OR_DEPARTMENT_OTHER): Payer: BC Managed Care – PPO | Admitting: Lab

## 2012-05-21 VITALS — BP 122/83 | HR 80 | Temp 97.4°F | Wt 256.3 lb

## 2012-05-21 DIAGNOSIS — D751 Secondary polycythemia: Secondary | ICD-10-CM

## 2012-05-21 DIAGNOSIS — Z8547 Personal history of malignant neoplasm of testis: Secondary | ICD-10-CM

## 2012-05-21 DIAGNOSIS — C6290 Malignant neoplasm of unspecified testis, unspecified whether descended or undescended: Secondary | ICD-10-CM | POA: Insufficient documentation

## 2012-05-21 LAB — CBC WITH DIFFERENTIAL/PLATELET
BASO%: 0.5 % (ref 0.0–2.0)
Basophils Absolute: 0 10*3/uL (ref 0.0–0.1)
HCT: 48.2 % (ref 38.4–49.9)
HGB: 17.1 g/dL (ref 13.0–17.1)
MONO#: 0.4 10*3/uL (ref 0.1–0.9)
NEUT%: 58.9 % (ref 39.0–75.0)
WBC: 3.9 10*3/uL — ABNORMAL LOW (ref 4.0–10.3)
lymph#: 1.1 10*3/uL (ref 0.9–3.3)

## 2012-05-21 LAB — COMPREHENSIVE METABOLIC PANEL (CC13)
ALT: 72 U/L — ABNORMAL HIGH (ref 0–55)
CO2: 24 mEq/L (ref 22–29)
Sodium: 140 mEq/L (ref 136–145)
Total Bilirubin: 0.79 mg/dL (ref 0.20–1.20)
Total Protein: 6.9 g/dL (ref 6.4–8.3)

## 2012-05-21 LAB — LACTATE DEHYDROGENASE (CC13): LDH: 186 U/L (ref 125–245)

## 2012-05-21 NOTE — Progress Notes (Signed)
Hematology and Oncology Follow Up Visit  Barry Mercado [id number] [birth year] 48 y.o. 05/21/2012 9:05 AM  CC: Barry Mercado, M.D.    Principle Diagnosis: A 47 year old gentleman diagnosed with stage IB nonseminomatous testis cancer diagnosed in 2007.   Prior Therapy: 1. Underwent a radical orchiectomy.  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  He continues to have persistent elevation in his tumor markers.  2. He received 3 cycles of BEP chemotherapy with normalization of his tumor marker.  Therapy ended in April 2008.  Current therapy: Observation and surveillance  Interim History:  Barry Mercado presents today for a followup visit.  He is a pleasant gentleman with a testes cancer as outlined above.  He has been in remission and continues to do very well at this point.  He reports no symptoms at this point. His abdominal pain flared up in 03/2012 and was seen in the ED and resolved spontaneously. He has no new complaints. He reports no masses or lesion in the testis on self examinations. Apatite and energy are excellent.    Medications: I have reviewed the patient's current medications. Current outpatient prescriptions:ibuprofen (ADVIL,MOTRIN) 200 MG tablet, Take 200 mg by mouth every 6 (six) hours as needed., Disp: , Rfl:   Allergies:  Allergies  Allergen Reactions  . Penicillins Other (See Comments)    Childhood     Past Medical History, Surgical history, Social history, and Family History were reviewed and updated.  Review of Systems: Constitutional:  Negative for fever, chills, night sweats, anorexia, weight loss, pain. Cardiovascular: no chest pain or dyspnea on exertion Respiratory: no cough, shortness of breath, or wheezing Neurological: no TIA or stroke symptoms Dermatological: negative ENT: negative Skin: Negative. Gastrointestinal: no abdominal pain, change in bowel habits, or black or bloody stools Genito-Urinary: no  dysuria, trouble voiding, or hematuria Hematological and Lymphatic: negative Breast: negative Musculoskeletal: negative Remaining ROS negative. Physical Exam: There were no vitals taken for this visit. ECOG: 0 General appearance: alert Head: Normocephalic, without obvious abnormality, atraumatic Neck: no adenopathy, no carotid bruit, no JVD, supple, symmetrical, trachea midline and thyroid not enlarged, symmetric, no tenderness/mass/nodules Lymph nodes: Cervical, supraclavicular, and axillary nodes normal. Heart:regular rate and rhythm, S1, S2 normal, no murmur, click, rub or gallop Lung:chest clear, no wheezing, rales, normal symmetric air entry Abdomin: soft, non-tender, without masses or organomegaly EXT:no erythema, induration, or nodules   Lab Results: Lab Results  Component Value Date   WBC 3.9* 05/21/2012   HGB 17.1 05/21/2012   HCT 48.2 05/21/2012   MCV 90.0 05/21/2012   PLT 134* 05/21/2012     Chemistry      Component Value Date/Time   NA 140 03/18/2012 1215   K 4.3 03/18/2012 1215   CL 102 03/18/2012 1215   CO2 24 03/18/2012 1215   BUN 19 03/18/2012 1215   CREATININE 1.04 03/18/2012 1215      Component Value Date/Time   CALCIUM 9.7 03/18/2012 1215   ALKPHOS 71 03/18/2012 1215   AST 34 03/18/2012 1215   ALT 62* 03/18/2012 1215   BILITOT 0.6 03/18/2012 1215      Results for Barry Mercado (MRN [id number]) as of 05/21/2012 08:17  Ref. Range 05/22/2011 08:44  AFP-Tumor Marker Latest Range: 0.0-8.0 ng/mL 2.9  Beta hCG, Tumor Marker Latest Range: < 5.0 mIU/mL < 0.5   CHEST - 2 VIEW  Comparison: 03/18/2012.  Findings: Trachea is midline. Heart size normal. Lungs are clear.  No pleural fluid.  IMPRESSION:  No acute findings.    Impression and Plan:  This is a pleasant 47 year old gentleman with the following issues:  1. Stage IB nonseminomatous testis cancer.  He is now more than 5 years removed from the  completion of chemotherapy without any evidence to suggest recurrent  disease. The plan is to continue follow up every 12 months with an exam, tumor markers and chest xray.   2. Erythrocytosis.  He has gained some weight and he might have developed some symptoms of obstructive sleep apnea. It is better now 3.    Followup will be in 12 months' time.  4.     Abdominal pain: resolved now work up has been negative.   [facility], MD 3/13/20149:05 AM

## 2012-05-21 NOTE — Telephone Encounter (Signed)
gv and printed appt schedule for pt for March 2015 °

## 2013-05-21 ENCOUNTER — Encounter: Payer: Self-pay | Admitting: Oncology

## 2013-05-21 ENCOUNTER — Ambulatory Visit (HOSPITAL_COMMUNITY)
Admission: RE | Admit: 2013-05-21 | Discharge: 2013-05-21 | Disposition: A | Discharge status: Home / Self Care | Payer: 59 | Source: Ambulatory Visit | Attending: Oncology | Admitting: Oncology

## 2013-05-21 ENCOUNTER — Ambulatory Visit (HOSPITAL_BASED_OUTPATIENT_CLINIC_OR_DEPARTMENT_OTHER): Payer: 59 | Admitting: Oncology

## 2013-05-21 ENCOUNTER — Other Ambulatory Visit (HOSPITAL_BASED_OUTPATIENT_CLINIC_OR_DEPARTMENT_OTHER): Payer: 59

## 2013-05-21 ENCOUNTER — Telehealth: Payer: Self-pay | Admitting: Oncology

## 2013-05-21 VITALS — BP 120/77 | HR 74 | Temp 98.4°F | Resp 18 | Ht 71.0 in | Wt 252.1 lb

## 2013-05-21 DIAGNOSIS — D751 Secondary polycythemia: Secondary | ICD-10-CM

## 2013-05-21 DIAGNOSIS — Z8547 Personal history of malignant neoplasm of testis: Secondary | ICD-10-CM

## 2013-05-21 DIAGNOSIS — C6290 Malignant neoplasm of unspecified testis, unspecified whether descended or undescended: Secondary | ICD-10-CM

## 2013-05-21 LAB — COMPREHENSIVE METABOLIC PANEL (CC13)
ALBUMIN: 4.2 g/dL (ref 3.5–5.0)
ALK PHOS: 70 U/L (ref 40–150)
ALT: 67 U/L — ABNORMAL HIGH (ref 0–55)
AST: 29 U/L (ref 5–34)
Anion Gap: 10 mEq/L (ref 3–11)
BUN: 15.2 mg/dL (ref 7.0–26.0)
CALCIUM: 9.9 mg/dL (ref 8.4–10.4)
CO2: 26 mEq/L (ref 22–29)
Chloride: 106 mEq/L (ref 98–109)
Creatinine: 1.2 mg/dL (ref 0.7–1.3)
GLUCOSE: 134 mg/dL (ref 70–140)
POTASSIUM: 4.4 meq/L (ref 3.5–5.1)
Sodium: 142 mEq/L (ref 136–145)
Total Bilirubin: 0.52 mg/dL (ref 0.20–1.20)
Total Protein: 7.2 g/dL (ref 6.4–8.3)

## 2013-05-21 LAB — LACTATE DEHYDROGENASE (CC13): LDH: 186 U/L (ref 125–245)

## 2013-05-21 LAB — CBC WITH DIFFERENTIAL/PLATELET
BASO%: 1 % (ref 0.0–2.0)
BASOS ABS: 0 10*3/uL (ref 0.0–0.1)
EOS ABS: 0.1 10*3/uL (ref 0.0–0.5)
EOS%: 2.6 % (ref 0.0–7.0)
HEMATOCRIT: 50.5 % — AB (ref 38.4–49.9)
HEMOGLOBIN: 17.1 g/dL (ref 13.0–17.1)
LYMPH%: 31.6 % (ref 14.0–49.0)
MCH: 31.4 pg (ref 27.2–33.4)
MCHC: 33.9 g/dL (ref 32.0–36.0)
MCV: 92.6 fL (ref 79.3–98.0)
MONO#: 0.4 10*3/uL (ref 0.1–0.9)
MONO%: 10.7 % (ref 0.0–14.0)
NEUT%: 54.1 % (ref 39.0–75.0)
NEUTROS ABS: 2.1 10*3/uL (ref 1.5–6.5)
PLATELETS: 150 10*3/uL (ref 140–400)
RBC: 5.46 10*6/uL (ref 4.20–5.82)
RDW: 12.6 % (ref 11.0–14.6)
WBC: 4 10*3/uL (ref 4.0–10.3)
lymph#: 1.3 10*3/uL (ref 0.9–3.3)

## 2013-05-21 NOTE — Telephone Encounter (Signed)
gv pt appt scheudule/referral for march 2016 cxr/lb/FS. pt also given referral for cxr today. see cxr orderd for march 2015 and march 2016.

## 2013-05-21 NOTE — Progress Notes (Signed)
Hematology and Oncology Follow Up Visit  Barry Mercado [id number] [birth year] 48 y.o. 05/21/2013 10:17 AM       Principle Diagnosis: A 48 year old gentleman diagnosed with stage IB nonseminomatous testis cancer diagnosed in 2007.   Prior Therapy: 1. Underwent a radical orchiectomy.  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  He continues to have persistent elevation in his tumor markers.  2. He received 3 cycles of BEP chemotherapy with normalization of his tumor marker.  Therapy ended in April 2008.  Current therapy: Observation and surveillance  Interim History:  Barry Mercado presents today for a followup visit.  He is a pleasant gentleman with a testes cancer as outlined above.  He has been in remission and continues to do very well at this point.  He reports no symptoms at this point. He has no new complaints. He reports no masses or lesion in the testis on self examinations. He has not had any headaches, motor vision or double vision. He has not reported any GI symptoms and change in his bowel habits. He does not report any urinary symptoms of frequency urgency or hesitancy. Does not report any adenopathy or masses. Continue to  work regularly.   Medications: I have reviewed the patient's current medications. Current outpatient prescriptions:ibuprofen (ADVIL,MOTRIN) 200 MG tablet, Take 200 mg by mouth every 6 (six) hours as needed., Disp: , Rfl:   Allergies:  Allergies  Allergen Reactions  . Penicillins Other (See Comments)    Childhood     Past Medical History, Surgical history, Social history, and Family History were reviewed and updated.  Review of Systems: Constitutional:  Negative for fever, chills, night sweats, anorexia, weight loss, pain. Cardiovascular: no chest pain or dyspnea on exertion Respiratory: no cough, shortness of breath, or wheezing Neurological: no TIA or stroke symptoms Remaining ROS negative. Physical  Exam: Blood pressure 120/77, pulse 74, temperature 98.4 F (36.9 C), temperature source Oral, resp. rate 18, height 5\' 11"  (1.803 m), weight 252 lb 1.6 oz (114.352 kg). ECOG: 0 General appearance: alert awake appeared in no active distress today. Head: Normocephalic, without obvious abnormality, atraumatic Neck: no adenopathy, no carotid bruit, no JVD, supple, symmetrical, trachea midline and thyroid not enlarged, symmetric, no tenderness/mass/nodules Lymph nodes: Cervical, supraclavicular, and axillary nodes normal. Heart:regular rate and rhythm, S1, S2 normal, no murmur, click, rub or gallop Lung:chest clear, no wheezing, rales, normal symmetric air entry Abdomin: soft, non-tender, without masses or organomegaly EXT:no erythema, induration, or nodules   Lab Results: Lab Results  Component Value Date   WBC 4.0 05/21/2013   HGB 17.1 05/21/2013   HCT 50.5* 05/21/2013   MCV 92.6 05/21/2013   PLT 150 05/21/2013     Chemistry      Component Value Date/Time   NA 140 05/21/2012 0823   NA 140 03/18/2012 1215   K 4.2 05/21/2012 0823   K 4.3 03/18/2012 1215   CL 107 05/21/2012 0823   CL 102 03/18/2012 1215   CO2 24 05/21/2012 0823   CO2 24 03/18/2012 1215   BUN 21.3 05/21/2012 0823   BUN 19 03/18/2012 1215   CREATININE 1.1 05/21/2012 0823   CREATININE 1.04 03/18/2012 1215      Component Value Date/Time   CALCIUM 8.8 05/21/2012 0823   CALCIUM 9.7 03/18/2012 1215   ALKPHOS 67 05/21/2012 0823   ALKPHOS 71 03/18/2012 1215   AST 30 05/21/2012 0823   AST 34 03/18/2012 1215   ALT 72* 05/21/2012 0823   ALT 62*  03/18/2012 1215   BILITOT 0.79 05/21/2012 0823   BILITOT 0.6 03/18/2012 1215       Results for Barry Mercado, Barry Mercado (MRN [id number]) as of 05/21/2013 10:00  Ref. Range 05/21/2012 08:23  AFP-Tumor Marker Latest Range: 0.0-8.0 ng/mL 1.3  Beta hCG, Tumor Marker Latest Range: <5.0 mIU/mL 0.5   Clinical Data: History of testicular cancer.  CHEST - 2 VIEW  Comparison: 03/18/2012.  Findings: Trachea is midline. Heart  size normal. Lungs are clear.  No pleural fluid.  IMPRESSION:  No acute findings   Impression and Plan:  This is a pleasant 48 year old gentleman with the following issues:  1. Stage IB nonseminomatous testis cancer.  He is now more than 5 years removed from the  completion of chemotherapy without any evidence to suggest recurrent disease. His laboratory data and chest x-ray from today were reviewed with the patient. The plan is to continue follow up every 12 months with an exam, tumor markers and chest xray.   2. Erythrocytosis.  He has gained some weight and he might have developed some symptoms of obstructive sleep apnea. It is better now 3.    Followup will be in 12 months' time.  4.     Age appropriate cancer screening: I discussed this with the patient today he is not due for a colonoscopy yet but I encouraged him to get it in the near future. We also discussed the risks of prostate cancer and the need for a prostate examination also in the near future.  [facility], MD 3/13/201510:17 AM

## 2013-05-25 LAB — BETA HCG QUANT (REF LAB): Beta hCG, Tumor Marker: <2 m[IU]/mL (ref <5.0)

## 2013-05-25 LAB — AFP TUMOR MARKER: AFP TUMOR MARKER: 2.4 ng/mL (ref 0.0–8.0)

## 2014-05-25 ENCOUNTER — Telehealth: Payer: Self-pay | Admitting: *Deleted

## 2014-05-25 ENCOUNTER — Other Ambulatory Visit: Payer: Self-pay | Admitting: *Deleted

## 2014-05-25 DIAGNOSIS — C6290 Malignant neoplasm of unspecified testis, unspecified whether descended or undescended: Secondary | ICD-10-CM

## 2014-05-25 NOTE — Telephone Encounter (Signed)
Patient called to check on his appt. For this Friday at 8am.  Let him know he was scheduled for lab work at [place] and then see Dr. Alen Blew at 8:30am.  Pt. Asked if he was scheduled for a chest xray and no he is not.  He said he will be here at 8am.   Upon further investigation - Dr. Alen Blew saw him one year ago and states that the plan is to have exam, tumor markers and CXR in 12 months. It does not look like a chest xray is ordered/scheduled.  Called Dr.Shadad's nurse, Randolm Idol RN and she will take care of this.

## 2014-05-26 ENCOUNTER — Encounter: Payer: Self-pay | Admitting: *Deleted

## 2014-05-26 ENCOUNTER — Ambulatory Visit (HOSPITAL_COMMUNITY)
Admission: RE | Admit: 2014-05-26 | Discharge: 2014-05-26 | Disposition: A | Discharge status: Home / Self Care | Payer: 59 | Source: Ambulatory Visit | Attending: Oncology | Admitting: Oncology

## 2014-05-26 DIAGNOSIS — C6290 Malignant neoplasm of unspecified testis, unspecified whether descended or undescended: Secondary | ICD-10-CM | POA: Insufficient documentation

## 2014-05-27 ENCOUNTER — Telehealth: Payer: Self-pay | Admitting: Oncology

## 2014-05-27 ENCOUNTER — Other Ambulatory Visit (HOSPITAL_COMMUNITY): Payer: 59

## 2014-05-27 ENCOUNTER — Other Ambulatory Visit (HOSPITAL_BASED_OUTPATIENT_CLINIC_OR_DEPARTMENT_OTHER): Payer: 59

## 2014-05-27 ENCOUNTER — Ambulatory Visit (HOSPITAL_BASED_OUTPATIENT_CLINIC_OR_DEPARTMENT_OTHER): Payer: 59 | Admitting: Oncology

## 2014-05-27 VITALS — BP 130/70 | HR 77 | Temp 98.1°F | Resp 18 | Ht 71.0 in | Wt 259.9 lb

## 2014-05-27 DIAGNOSIS — Z8547 Personal history of malignant neoplasm of testis: Secondary | ICD-10-CM

## 2014-05-27 DIAGNOSIS — D751 Secondary polycythemia: Secondary | ICD-10-CM

## 2014-05-27 DIAGNOSIS — C6290 Malignant neoplasm of unspecified testis, unspecified whether descended or undescended: Secondary | ICD-10-CM

## 2014-05-27 LAB — CBC WITH DIFFERENTIAL/PLATELET
BASO%: 0.5 % (ref 0.0–2.0)
BASOS ABS: 0 10*3/uL (ref 0.0–0.1)
EOS%: 2.9 % (ref 0.0–7.0)
Eosinophils Absolute: 0.1 10*3/uL (ref 0.0–0.5)
HEMATOCRIT: 45.9 % (ref 38.4–49.9)
HGB: 16.4 g/dL (ref 13.0–17.1)
LYMPH%: 29.4 % (ref 14.0–49.0)
MCH: 32.4 pg (ref 27.2–33.4)
MCHC: 35.7 g/dL (ref 32.0–36.0)
MCV: 90.7 fL (ref 79.3–98.0)
MONO#: 0.4 10*3/uL (ref 0.1–0.9)
MONO%: 10.6 % (ref 0.0–14.0)
NEUT#: 2.1 10*3/uL (ref 1.5–6.5)
NEUT%: 56.6 % (ref 39.0–75.0)
Platelets: 142 10*3/uL (ref 140–400)
RBC: 5.06 10*6/uL (ref 4.20–5.82)
RDW: 12.7 % (ref 11.0–14.6)
WBC: 3.8 10*3/uL — AB (ref 4.0–10.3)
lymph#: 1.1 10*3/uL (ref 0.9–3.3)

## 2014-05-27 LAB — COMPREHENSIVE METABOLIC PANEL (CC13)
ALBUMIN: 3.9 g/dL (ref 3.5–5.0)
ALT: 54 U/L (ref 0–55)
ANION GAP: 8 meq/L (ref 3–11)
AST: 28 U/L (ref 5–34)
Alkaline Phosphatase: 70 U/L (ref 40–150)
BUN: 18.6 mg/dL (ref 7.0–26.0)
CHLORIDE: 106 meq/L (ref 98–109)
CO2: 26 meq/L (ref 22–29)
Calcium: 8.8 mg/dL (ref 8.4–10.4)
Creatinine: 1.1 mg/dL (ref 0.7–1.3)
EGFR: 76 mL/min/{1.73_m2} — AB (ref >90)
Glucose: 124 mg/dl (ref 70–140)
POTASSIUM: 3.9 meq/L (ref 3.5–5.1)
Sodium: 140 mEq/L (ref 136–145)
Total Bilirubin: 0.84 mg/dL (ref 0.20–1.20)
Total Protein: 6.7 g/dL (ref 6.4–8.3)

## 2014-05-27 LAB — LACTATE DEHYDROGENASE (CC13): LDH: 203 U/L (ref 125–245)

## 2014-05-27 NOTE — Progress Notes (Signed)
Hematology and Oncology Follow Up Visit  Barry Mercado [id number] Feb 09, 1966 49 y.o. 05/27/2014 8:38 AM       Principle Diagnosis: A 49 year old gentleman diagnosed with stage IB nonseminomatous testis cancer diagnosed in 2007.   Prior Therapy: 1. Underwent a radical orchiectomy.  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  He continues to have persistent elevation in his tumor markers.  2. He received 3 cycles of BEP chemotherapy with normalization of his tumor marker.  Therapy ended in April 2008.  Current therapy: Observation and surveillance  Interim History:  Barry Mercado presents today for a followup visit. Since the last visit, he continues to do very well. He reports no symptoms at this point. He reports no masses or lesion in the testis on self examinations. He has not had any headaches, motor vision or double vision. He has not reported any GI symptoms and change in his bowel habits. He does not report any urinary symptoms of frequency urgency or hesitancy. Does not report any adenopathy or masses. Continue to  work regularly. He exercises regularly without any decline in his energy her performance status. The remaining review of systems unremarkable.   Medications: I have reviewed the patient's current medications.  Current outpatient prescriptions:  .  ibuprofen (ADVIL,MOTRIN) 200 MG tablet, Take 200 mg by mouth every 6 (six) hours as needed., Disp: , Rfl:   Allergies:  Allergies  Allergen Reactions  . Penicillins Other (See Comments)    Childhood     Past Medical History, Surgical history, Social history, and Family History were reviewed and updated.  Physical Exam: Blood pressure 130/70, pulse 77, temperature 98.1 F (36.7 C), temperature source Oral, resp. rate 18, height 5\' 11"  (1.803 m), weight 259 lb 14.4 oz (117.89 kg), SpO2 97 %. ECOG: 0 General appearance: alert awake appeared in no active distress today. Head:  Normocephalic, without obvious abnormality, atraumatic Neck: no adenopathy Lymph nodes: Cervical, supraclavicular, and axillary nodes normal. Heart:regular rate and rhythm, S1, S2 normal, no murmur, click, rub or gallop Lung:chest clear, no wheezing, rales, normal symmetric air entry Abdomin: soft, non-tender, without masses or organomegaly EXT:no erythema, induration, or nodules   Lab Results: Lab Results  Component Value Date   WBC 3.8* 05/27/2014   HGB 16.4 05/27/2014   HCT 45.9 05/27/2014   MCV 90.7 05/27/2014   PLT 142 05/27/2014     Chemistry      Component Value Date/Time   NA 142 05/21/2013 0949   NA 140 03/18/2012 1215   K 4.4 05/21/2013 0949   K 4.3 03/18/2012 1215   CL 107 05/21/2012 0823   CL 102 03/18/2012 1215   CO2 26 05/21/2013 0949   CO2 24 03/18/2012 1215   BUN 15.2 05/21/2013 0949   BUN 19 03/18/2012 1215   CREATININE 1.2 05/21/2013 0949   CREATININE 1.04 03/18/2012 1215      Component Value Date/Time   CALCIUM 9.9 05/21/2013 0949   CALCIUM 9.7 03/18/2012 1215   ALKPHOS 70 05/21/2013 0949   ALKPHOS 71 03/18/2012 1215   AST 29 05/21/2013 0949   AST 34 03/18/2012 1215   ALT 67* 05/21/2013 0949   ALT 62* 03/18/2012 1215   BILITOT 0.52 05/21/2013 0949   BILITOT 0.6 03/18/2012 1215      EXAM: CHEST 2 VIEW  COMPARISON: 05/21/2013  FINDINGS: The heart size and mediastinal contours are within normal limits. Both lungs are clear. The visualized skeletal structures are unremarkable.  IMPRESSION: No active cardiopulmonary disease.  Impression and Plan:  This is a pleasant 49 year old gentleman with the following issues:  1. Stage IB nonseminomatous testis cancer.  He is now 8 years removed from the  completion of chemotherapy without any evidence to suggest recurrent disease. His laboratory data and chest x-ray from today were reviewed with the patient. The plan is to continue follow up every 12 months with an exam, tumor markers and  chest xray.   2. Erythrocytosis.  He has gained some weight and he might have developed some symptoms of obstructive sleep apnea. It is better now 3.     Age appropriate cancer screening: He is scheduled for colonoscopy this year. 4.     Follow-up: Will be in 12 months sooner if there is any problems.  [facility], MD 3/18/20168:38 AM

## 2014-05-27 NOTE — Telephone Encounter (Signed)
gv and printed appt sched and avs forpt for March 2016 °

## 2014-05-31 LAB — BETA HCG QUANT (REF LAB): Beta hCG, Tumor Marker: <2 m[IU]/mL (ref <5.0)

## 2014-05-31 LAB — AFP TUMOR MARKER: AFP-Tumor Marker: 3.1 ng/mL (ref <6.1)

## 2015-05-23 ENCOUNTER — Encounter: Payer: Self-pay | Admitting: *Deleted

## 2015-05-23 DIAGNOSIS — C6290 Malignant neoplasm of unspecified testis, unspecified whether descended or undescended: Secondary | ICD-10-CM

## 2015-05-25 ENCOUNTER — Ambulatory Visit (HOSPITAL_COMMUNITY)
Admission: RE | Admit: 2015-05-25 | Discharge: 2015-05-25 | Disposition: A | Discharge status: Home / Self Care | Payer: BLUE CROSS/BLUE SHIELD | Source: Ambulatory Visit | Attending: Oncology | Admitting: Oncology

## 2015-05-25 DIAGNOSIS — C6290 Malignant neoplasm of unspecified testis, unspecified whether descended or undescended: Secondary | ICD-10-CM | POA: Insufficient documentation

## 2015-05-26 ENCOUNTER — Ambulatory Visit (HOSPITAL_BASED_OUTPATIENT_CLINIC_OR_DEPARTMENT_OTHER): Payer: BLUE CROSS/BLUE SHIELD | Admitting: Oncology

## 2015-05-26 ENCOUNTER — Telehealth: Payer: Self-pay | Admitting: Oncology

## 2015-05-26 ENCOUNTER — Other Ambulatory Visit (HOSPITAL_BASED_OUTPATIENT_CLINIC_OR_DEPARTMENT_OTHER): Payer: BLUE CROSS/BLUE SHIELD

## 2015-05-26 VITALS — BP 122/73 | HR 72 | Temp 98.1°F | Resp 18 | Ht 71.0 in | Wt 239.2 lb

## 2015-05-26 DIAGNOSIS — Z8547 Personal history of malignant neoplasm of testis: Secondary | ICD-10-CM | POA: Diagnosis not present

## 2015-05-26 DIAGNOSIS — C6290 Malignant neoplasm of unspecified testis, unspecified whether descended or undescended: Secondary | ICD-10-CM

## 2015-05-26 LAB — COMPREHENSIVE METABOLIC PANEL
ALT: 36 U/L (ref 0–55)
ANION GAP: 9 meq/L (ref 3–11)
AST: 23 U/L (ref 5–34)
Albumin: 4.2 g/dL (ref 3.5–5.0)
Alkaline Phosphatase: 65 U/L (ref 40–150)
BUN: 17.4 mg/dL (ref 7.0–26.0)
CHLORIDE: 109 meq/L (ref 98–109)
CO2: 23 meq/L (ref 22–29)
CREATININE: 1.1 mg/dL (ref 0.7–1.3)
Calcium: 9 mg/dL (ref 8.4–10.4)
EGFR: 78 mL/min/{1.73_m2} — ABNORMAL LOW (ref >90)
Glucose: 103 mg/dl (ref 70–140)
Potassium: 4.1 mEq/L (ref 3.5–5.1)
SODIUM: 141 meq/L (ref 136–145)
Total Bilirubin: 0.83 mg/dL (ref 0.20–1.20)
Total Protein: 7.2 g/dL (ref 6.4–8.3)

## 2015-05-26 LAB — CBC WITH DIFFERENTIAL/PLATELET
BASO%: 0.6 % (ref 0.0–2.0)
Basophils Absolute: 0 10*3/uL (ref 0.0–0.1)
EOS%: 2.5 % (ref 0.0–7.0)
Eosinophils Absolute: 0.1 10*3/uL (ref 0.0–0.5)
HCT: 46.8 % (ref 38.4–49.9)
HGB: 16 g/dL (ref 13.0–17.1)
LYMPH%: 26.9 % (ref 14.0–49.0)
MCH: 31.4 pg (ref 27.2–33.4)
MCHC: 34.3 g/dL (ref 32.0–36.0)
MCV: 91.6 fL (ref 79.3–98.0)
MONO#: 0.4 10*3/uL (ref 0.1–0.9)
MONO%: 9.8 % (ref 0.0–14.0)
NEUT#: 2.4 10*3/uL (ref 1.5–6.5)
NEUT%: 60.2 % (ref 39.0–75.0)
Platelets: 159 10*3/uL (ref 140–400)
RBC: 5.11 10*6/uL (ref 4.20–5.82)
RDW: 12.8 % (ref 11.0–14.6)
WBC: 4 10*3/uL (ref 4.0–10.3)
lymph#: 1.1 10*3/uL (ref 0.9–3.3)

## 2015-05-26 LAB — LACTATE DEHYDROGENASE: LDH: 171 U/L (ref 125–245)

## 2015-05-26 NOTE — Progress Notes (Signed)
Hematology and Oncology Follow Up Visit  Barry Mercado [id number] 04-23-1965 50 y.o. 05/26/2015 9:37 AM       Principle Diagnosis: A 50 year old gentleman diagnosed with stage IB nonseminomatous testis cancer diagnosed in 2007.   Prior Therapy: 1. Underwent radical orchiectomy in 02/2006 .  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  He continues to have persistent elevation in his tumor markers.  2. He received 3 cycles of BEP chemotherapy with normalization of his tumor marker.  Therapy ended in April 2008.  Current therapy: Observation and surveillance  Interim History:  [name] presents today for a followup visit. Since the last visit, he reports no recent complaints. He continues to be in excellent health and shape without any hospitalization or illnesses. He exercises regularly and have intentionally lost weight. He has been trying to eat healthy which have helped lose 35 pounds.    He reports no masses or lesion in the testis on self examinations. Has not reported any lymphadenopathy or dysuria. Does not report any penile discharge or dysuria.  He has not had any headaches, motor vision or double vision. He does not report any fevers, chills or sweats. Does not report any chest pain, palpitation orthopnea. Does not report any cough, wheezing or hemoptysis. He has not reported any GI symptoms and change in his bowel habits. He does not report any urinary symptoms of frequency urgency or hesitancy. The remaining review of systems unremarkable.   Medications: I have reviewed the patient's current medications.  Current outpatient prescriptions:  .  ibuprofen (ADVIL,MOTRIN) 200 MG tablet, Take 200 mg by mouth every 6 (six) hours as needed., Disp: , Rfl:   Allergies:  Allergies  Allergen Reactions  . Penicillins Other (See Comments)    Childhood     Past Medical History, Surgical history, Social history, and Family History were reviewed  and updated.  Physical Exam: Blood pressure 122/73, pulse 72, temperature 98.1 F (36.7 C), temperature source Oral, resp. rate 18, height 5\' 11"  (1.803 m), weight 239 lb 3.2 oz (108.5 kg), SpO2 99 %. ECOG: 0 General appearance: alert healthy-appearing gentleman without distress. Head: Normocephalic, without obvious abnormality no oral ulcers or lesions. Neck: no adenopathy Lymph nodes: Cervical, supraclavicular, and axillary nodes normal. Heart:regular rate and rhythm, S1, S2 normal, no murmur, click, rub or gallop Lung:chest clear, no wheezing, rales, normal symmetric air entry Abdomin: soft, non-tender, without masses or organomegaly dullness or ascites. EXT:no erythema, induration, or nodules   Lab Results: Lab Results  Component Value Date   WBC 4.0 05/26/2015   HGB 16.0 05/26/2015   HCT 46.8 05/26/2015   MCV 91.6 05/26/2015   PLT 159 05/26/2015     Chemistry      Component Value Date/Time   NA 141 05/26/2015 0829   NA 140 03/18/2012 1215   K 4.1 05/26/2015 0829   K 4.3 03/18/2012 1215   CL 107 05/21/2012 0823   CL 102 03/18/2012 1215   CO2 23 05/26/2015 0829   CO2 24 03/18/2012 1215   BUN 17.4 05/26/2015 0829   BUN 19 03/18/2012 1215   CREATININE 1.1 05/26/2015 0829   CREATININE 1.04 03/18/2012 1215      Component Value Date/Time   CALCIUM 9.0 05/26/2015 0829   CALCIUM 9.7 03/18/2012 1215   ALKPHOS 65 05/26/2015 0829   ALKPHOS 71 03/18/2012 1215   AST 23 05/26/2015 0829   AST 34 03/18/2012 1215   ALT 36 05/26/2015 0829   ALT 62* 03/18/2012  1215   BILITOT 0.83 05/26/2015 0829   BILITOT 0.6 03/18/2012 1215     EXAM: CHEST 2 VIEW  COMPARISON: May 26, 2014.  FINDINGS: The heart size and mediastinal contours are within normal limits. Both lungs are clear. No pneumothorax or pleural effusion is noted. The visualized skeletal structures are unremarkable.  IMPRESSION: No active cardiopulmonary disease.    Impression and Plan:  This is a  pleasant 50 year old gentleman with the following issues:  1. Stage IB nonseminomatous testis cancer. He is status post definitive therapy that ended in 2008. He received orchiectomy as well as chemotherapy for persistent tumor markers and have been in remission since 2008. His laboratory data and chest x-ray from today were reviewed with the patient and showed no evidence of recurrent disease.. The plan is to continue follow up every 12 months with an exam, tumor markers and chest xray.   2. Erythrocytosis. This has resolved after losing weight. 3.     Age appropriate cancer screening: He is status post a colonoscopy last year which was unremarkable. 4.     Follow-up: Will be in 12 months sooner if there is any problems.  [facility], MD 3/17/20179:37 AM

## 2015-05-26 NOTE — Telephone Encounter (Signed)
Gave adn pritned appt sched and avs for pt for March 2018

## 2015-05-27 LAB — AFP TUMOR MARKER: AFP, SERUM, TUMOR MARKER: 3.7 ng/mL (ref 0.0–8.3)

## 2015-05-27 LAB — BETA HCG QUANT (REF LAB)

## 2015-05-30 LAB — ALPHA FETO PROTEIN (PARALLEL TESTING): AFP-Tumor Marker: 2.7 ng/mL (ref <6.1)

## 2015-05-30 LAB — BETA HCG, QN (PARALLEL TESTING): Beta hCG, Tumor Marker: <2 m[IU]/mL

## 2015-07-04 ENCOUNTER — Other Ambulatory Visit: Payer: Self-pay | Admitting: Oncology

## 2016-05-23 ENCOUNTER — Other Ambulatory Visit: Payer: Self-pay | Admitting: *Deleted

## 2016-05-23 ENCOUNTER — Ambulatory Visit (HOSPITAL_COMMUNITY)
Admission: RE | Admit: 2016-05-23 | Discharge: 2016-05-23 | Disposition: A | Discharge status: Home / Self Care | Payer: BLUE CROSS/BLUE SHIELD | Source: Ambulatory Visit | Attending: Oncology | Admitting: Oncology

## 2016-05-23 DIAGNOSIS — C6290 Malignant neoplasm of unspecified testis, unspecified whether descended or undescended: Secondary | ICD-10-CM

## 2016-05-24 ENCOUNTER — Telehealth: Payer: Self-pay | Admitting: Oncology

## 2016-05-24 ENCOUNTER — Ambulatory Visit (HOSPITAL_BASED_OUTPATIENT_CLINIC_OR_DEPARTMENT_OTHER): Payer: BLUE CROSS/BLUE SHIELD | Admitting: Oncology

## 2016-05-24 ENCOUNTER — Other Ambulatory Visit (HOSPITAL_BASED_OUTPATIENT_CLINIC_OR_DEPARTMENT_OTHER): Payer: BLUE CROSS/BLUE SHIELD

## 2016-05-24 VITALS — BP 117/73 | HR 63 | Temp 98.0°F | Resp 18 | Ht 71.0 in | Wt 237.4 lb

## 2016-05-24 DIAGNOSIS — R351 Nocturia: Secondary | ICD-10-CM

## 2016-05-24 DIAGNOSIS — C6292 Malignant neoplasm of left testis, unspecified whether descended or undescended: Secondary | ICD-10-CM

## 2016-05-24 DIAGNOSIS — C6290 Malignant neoplasm of unspecified testis, unspecified whether descended or undescended: Secondary | ICD-10-CM

## 2016-05-24 DIAGNOSIS — Z8547 Personal history of malignant neoplasm of testis: Secondary | ICD-10-CM

## 2016-05-24 LAB — COMPREHENSIVE METABOLIC PANEL
ALT: 34 U/L (ref 0–55)
AST: 21 U/L (ref 5–34)
Albumin: 4.2 g/dL (ref 3.5–5.0)
Alkaline Phosphatase: 64 U/L (ref 40–150)
Anion Gap: 7 mEq/L (ref 3–11)
BUN: 16.2 mg/dL (ref 7.0–26.0)
CO2: 28 meq/L (ref 22–29)
Calcium: 9.3 mg/dL (ref 8.4–10.4)
Chloride: 107 mEq/L (ref 98–109)
Creatinine: 1.2 mg/dL (ref 0.7–1.3)
EGFR: 73 mL/min/{1.73_m2} — AB (ref >90)
GLUCOSE: 110 mg/dL (ref 70–140)
POTASSIUM: 4.6 meq/L (ref 3.5–5.1)
SODIUM: 142 meq/L (ref 136–145)
TOTAL PROTEIN: 6.8 g/dL (ref 6.4–8.3)
Total Bilirubin: 0.59 mg/dL (ref 0.20–1.20)

## 2016-05-24 LAB — CBC WITH DIFFERENTIAL/PLATELET
BASO%: 0.8 % (ref 0.0–2.0)
Basophils Absolute: 0 10*3/uL (ref 0.0–0.1)
EOS ABS: 0.1 10*3/uL (ref 0.0–0.5)
EOS%: 2.3 % (ref 0.0–7.0)
HCT: 45.6 % (ref 38.4–49.9)
HGB: 15.6 g/dL (ref 13.0–17.1)
LYMPH%: 33.2 % (ref 14.0–49.0)
MCH: 31.3 pg (ref 27.2–33.4)
MCHC: 34.2 g/dL (ref 32.0–36.0)
MCV: 91.6 fL (ref 79.3–98.0)
MONO#: 0.4 10*3/uL (ref 0.1–0.9)
MONO%: 14.3 % — AB (ref 0.0–14.0)
NEUT#: 1.5 10*3/uL (ref 1.5–6.5)
NEUT%: 49.4 % (ref 39.0–75.0)
Platelets: 154 10*3/uL (ref 140–400)
RBC: 4.98 10*6/uL (ref 4.20–5.82)
RDW: 13 % (ref 11.0–14.6)
WBC: 3.1 10*3/uL — AB (ref 4.0–10.3)
lymph#: 1 10*3/uL (ref 0.9–3.3)

## 2016-05-24 LAB — LACTATE DEHYDROGENASE: LDH: 153 U/L (ref 125–245)

## 2016-05-24 NOTE — Progress Notes (Signed)
Hematology and Oncology Follow Up Visit  Barry Mercado [id number] [birth year] 51 y.o. 05/24/2016 8:38 AM       Principle Diagnosis: 51 year old gentleman diagnosed with stage IB nonseminomatous testis cancer diagnosed in 2007.   Prior Therapy: 1. Underwent radical orchiectomy in 02/2006 .  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  He continues to have persistent elevation in his tumor markers.  2. He received 3 cycles of BEP chemotherapy with normalization of his tumor marker.  Therapy ended in April 2008.  Current therapy: Observation and surveillance  Interim History:  Barry Mercado presents today for a followup visit. Since the last visit, he continues to be in excellent health and shape without any recent complaints. He continues to watch his diet carefully and exercises regularly because of elevated hemoglobin A1c. He has not required any treatment for his prediabetic condition. He denied any recent complaints including pelvic adenopathy, urinary symptoms including hematuria or dysuria. He is experiencing some nocturia and his urine flow remains adequate.  He has not had any headaches, motor vision or double vision. He does not report any fevers, chills or sweats. Does not report any chest pain, palpitation orthopnea. Does not report any cough, wheezing or hemoptysis. He has not reported any GI symptoms and change in his bowel habits. He does not report any urinary symptoms of frequency urgency or hesitancy. The remaining review of systems unremarkable.   Medications: I have reviewed the patient's current medications.  Current Outpatient Prescriptions:  .  ibuprofen (ADVIL,MOTRIN) 200 MG tablet, Take 200 mg by mouth every 6 (six) hours as needed., Disp: , Rfl:   Allergies:  Allergies  Allergen Reactions  . Penicillins Other (See Comments)    Childhood     Past Medical History, Surgical history, Social history, and Family History were  reviewed and updated.  Physical Exam: Blood pressure 117/73, pulse 63, temperature 98 F (36.7 C), temperature source Oral, resp. rate 18, height 5\' 11"  (1.803 m), weight 237 lb 6.4 oz (107.7 kg), SpO2 95 %. ECOG: 0 General appearance: Well-appearing gentleman appeared without distress. Head: Normocephalic, without obvious abnormality no oral thrush or ulcers. Neck: no adenopathy or thyroid masses. Lymph nodes: Cervical, supraclavicular, and axillary nodes normal. Heart:regular rate and rhythm, S1, S2 normal, no murmur, click, rub or gallop Lung:chest clear, no wheezing, rales, normal symmetric air entry Abdomin: soft, non-tender, without masses or organomegaly no rebound or guarding. EXT:no erythema, induration, or nodules   Lab Results: Lab Results  Component Value Date   WBC 3.1 (L) 05/24/2016   HGB 15.6 05/24/2016   HCT 45.6 05/24/2016   MCV 91.6 05/24/2016   PLT 154 05/24/2016     Chemistry      Component Value Date/Time   NA 141 05/26/2015 0829   K 4.1 05/26/2015 0829   CL 107 05/21/2012 0823   CO2 23 05/26/2015 0829   BUN 17.4 05/26/2015 0829   CREATININE 1.1 05/26/2015 0829      Component Value Date/Time   CALCIUM 9.0 05/26/2015 0829   ALKPHOS 65 05/26/2015 0829   AST 23 05/26/2015 0829   ALT 36 05/26/2015 0829   BILITOT 0.83 05/26/2015 0829     FINDINGS: Heart and mediastinal contours are within normal limits. No focal opacities or effusions. No acute bony abnormality.  IMPRESSION: No active cardiopulmonary disease.     Impression and Plan:  51 year old gentleman with the following issues:  1. Stage IB nonseminomatous testis cancer. He is status post definitive therapy concluded  in April of 2008. He received orchiectomy as well as chemotherapy for persistent tumor markers and have been in remission since 2008. His physical examination and laboratory data including a chest x-ray showed no evidence of disease recurrence. Despite his low risk of  recurrence at this point moving forward we will continue annual follow-up per his request. 2. Erythrocytosis. This has resolved after losing weight. 3.     Age appropriate cancer screening: He is status post a colonoscopy in 2016. We have discussed prostate health with him and he will consider screening in the future. 4.     Follow-up: Will be in 12 months sooner if there is any problems.  [facility], MD 3/16/20188:38 AM

## 2016-05-24 NOTE — Telephone Encounter (Signed)
Appointments scheduled per 3.16.18 LOS. Patient given AVS report and calendars with future scheduled appointments.  °

## 2016-05-25 LAB — BETA HCG QUANT (REF LAB): hCG Quant: <1 m[IU]/mL (ref 0–3)

## 2016-05-25 LAB — AFP TUMOR MARKER: AFP, Serum, Tumor Marker: 3.4 ng/mL (ref 0.0–8.3)

## 2017-05-23 ENCOUNTER — Telehealth: Payer: Self-pay | Admitting: Oncology

## 2017-05-23 ENCOUNTER — Inpatient Hospital Stay: Payer: BLUE CROSS/BLUE SHIELD

## 2017-05-23 ENCOUNTER — Inpatient Hospital Stay: Payer: BLUE CROSS/BLUE SHIELD | Attending: Oncology | Admitting: Oncology

## 2017-05-23 VITALS — BP 124/86 | HR 78 | Temp 98.2°F | Resp 16 | Ht 71.0 in | Wt 239.4 lb

## 2017-05-23 DIAGNOSIS — Z8547 Personal history of malignant neoplasm of testis: Secondary | ICD-10-CM | POA: Diagnosis present

## 2017-05-23 DIAGNOSIS — R351 Nocturia: Secondary | ICD-10-CM | POA: Diagnosis present

## 2017-05-23 DIAGNOSIS — C6292 Malignant neoplasm of left testis, unspecified whether descended or undescended: Secondary | ICD-10-CM

## 2017-05-23 LAB — COMPREHENSIVE METABOLIC PANEL
ALBUMIN: 4.1 g/dL (ref 3.5–5.0)
ALT: 43 U/L (ref 0–55)
ANION GAP: 8 (ref 3–11)
AST: 26 U/L (ref 5–34)
Alkaline Phosphatase: 71 U/L (ref 40–150)
BILIRUBIN TOTAL: 0.7 mg/dL (ref 0.2–1.2)
BUN: 12 mg/dL (ref 7–26)
CO2: 24 mmol/L (ref 22–29)
Calcium: 9.5 mg/dL (ref 8.4–10.4)
Chloride: 108 mmol/L (ref 98–109)
Creatinine, Ser: 1.2 mg/dL (ref 0.70–1.30)
GFR calc Af Amer: >60 mL/min (ref >60)
GFR calc non Af Amer: >60 mL/min (ref >60)
GLUCOSE: 105 mg/dL (ref 70–140)
Potassium: 4.6 mmol/L (ref 3.5–5.1)
Sodium: 140 mmol/L (ref 136–145)
TOTAL PROTEIN: 7.1 g/dL (ref 6.4–8.3)

## 2017-05-23 LAB — CBC WITH DIFFERENTIAL/PLATELET
BASOS ABS: 0 10*3/uL (ref 0.0–0.1)
BASOS PCT: 1 %
EOS PCT: 4 %
Eosinophils Absolute: 0.1 10*3/uL (ref 0.0–0.5)
HCT: 46.2 % (ref 38.4–49.9)
Hemoglobin: 15.9 g/dL (ref 13.0–17.1)
Lymphocytes Relative: 33 %
Lymphs Abs: 1.1 10*3/uL (ref 0.9–3.3)
MCH: 31.2 pg (ref 27.2–33.4)
MCHC: 34.3 g/dL (ref 32.0–36.0)
MCV: 90.9 fL (ref 79.3–98.0)
MONO ABS: 0.4 10*3/uL (ref 0.1–0.9)
Monocytes Relative: 13 %
NEUTROS ABS: 1.6 10*3/uL (ref 1.5–6.5)
Neutrophils Relative %: 49 %
PLATELETS: 159 10*3/uL (ref 140–400)
RBC: 5.09 MIL/uL (ref 4.20–5.82)
RDW: 12.7 % (ref 11.0–14.6)
WBC: 3.3 10*3/uL — ABNORMAL LOW (ref 4.0–10.3)

## 2017-05-23 LAB — LACTATE DEHYDROGENASE: LDH: 190 U/L (ref 125–245)

## 2017-05-23 NOTE — Telephone Encounter (Signed)
Gave patient AVS and calendar of upcoming march 2020 appointment.

## 2017-05-23 NOTE — Progress Notes (Signed)
Hematology and Oncology Follow Up Visit  Barry Mercado [id number] December 03, 1965 52 y.o. 05/23/2017 9:06 AM       Principle Diagnosis: 52 year old man with nonseminomatous testis cancer diagnosed in 2007.  He was found to stage IB.   Prior Therapy: 1. Underwent radical orchiectomy in 02/2006 .  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   2. He received 3 cycles of BEP chemotherapy with normalization of his tumor marker.  Therapy ended in April 2008.  Current therapy: Active surveillance.  Interim History:  [name] here for a follow-up visit.  Since the last visit, he does not report any major changes in his health.  He remains active and continues to attend to activities of daily living.  He does report nocturia which has not worsened in his urine stream has been acceptable.  He denies any hematuria or dysuria.  He denies any testicular lesion or any genital discomfort.  He continues to work full-time without any decline in his ability to do so.  He has not had any headaches, blurry vision or double vision. He does not report any fevers, chills or sweats. Does not report any chest pain, palpitation orthopnea. Does not report any cough, wheezing or hemoptysis. He has not reported any nausea, vomiting or abdominal pain. He does not report any urinary symptoms of frequency urgency or hesitancy.  He does not report any musculoskeletal complaints.  He does not report any arthralgias or myalgias.  He does not report any skin rashes or lesions.  He does not report any lymphadenopathy or petechiae.  The remaining review of systems is negative.   Medications: I have reviewed the patient's current medications.  Current Outpatient Medications:  .  ibuprofen (ADVIL,MOTRIN) 200 MG tablet, Take 200 mg by mouth every 6 (six) hours as needed., Disp: , Rfl:   Allergies:  Allergies  Allergen Reactions  . Penicillins Other (See Comments)    Childhood     Past  Medical History, Surgical history, Social history, and Family History been unchanged on today's exam.  Physical Exam: Blood pressure 124/86, pulse 78, temperature 98.2 F (36.8 C), temperature source Oral, resp. rate 16, height 5\' 11"  (1.803 m), weight 239 lb 6.4 oz (108.6 kg), SpO2 96 %.   ECOG: 0 General appearance: Alert, awake gentleman without distress. Head: Atraumatic without any abnormalities. Oropharynx without any thrush or ulcers. Eyes: No scleral icterus. Lymph nodes: Cervical, supraclavicular, and axillary nodes normal. Heart: Regular rate and rhythm without any murmurs rubs or gallops. Lung: Clear to auscultation without any rhonchi, wheezes or dullness to percussion. Abdomin: Soft, nontender, nondistended without any shifting dullness or ascites. Musculoskeletal: No joint deformity or effusion. Neurological: No deficits.  Motor, sensory and deep tendon reflexes are intact.   Lab Results: Lab Results  Component Value Date   WBC 3.1 (L) 05/24/2016   HGB 15.6 05/24/2016   HCT 45.6 05/24/2016   MCV 91.6 05/24/2016   PLT 154 05/24/2016     Chemistry      Component Value Date/Time   NA 142 05/24/2016 0816   K 4.6 05/24/2016 0816   CL 107 05/21/2012 0823   CO2 28 05/24/2016 0816   BUN 16.2 05/24/2016 0816   CREATININE 1.2 05/24/2016 0816      Component Value Date/Time   CALCIUM 9.3 05/24/2016 0816   ALKPHOS 64 05/24/2016 0816   AST 21 05/24/2016 0816   ALT 34 05/24/2016 0816   BILITOT 0.59 05/24/2016 0816  Impression and Plan:  52 year old man with 1. Testicular cancer diagnosed in 2007.  He was found to have stage IB nonseminomatous testis cancer. He is status post orchiectomy followed by BEP chemotherapy for persistent elevation in his tumor markers.  He is over 10 years out from his treatment without any evidence of recurrent disease.  His tumor markers are currently pending from today but have been normal since the conclusion of chemotherapy.  The  natural course of this disease was discussed today with the patient as well as the risk of relapse was evaluated.  His risk of recurrence is very low but we will continue annual surveillance at this time. 2. Survivorship implications: No long-term complications noted from his chemotherapy.  Long-term cardiovascular complications related to cisplatin as well as pulmonary complications related to bleomycin were reviewed.  He is up-to-date on his cholesterol screening as well as cardiovascular risk modification. 3.     Age appropriate cancer screening: He is up-to-date and his colonoscopy was completed in 2016. 4.     Follow-up: Will be in 12 months.   15  minutes was spent with the patient face-to-face today.  More than 50% of time was dedicated to patient counseling, education and coordination of his care.   Zola Button, MD 3/15/20199:06 AM

## 2017-05-24 LAB — BETA HCG QUANT (REF LAB)

## 2017-05-24 LAB — AFP TUMOR MARKER: AFP, SERUM, TUMOR MARKER: 3.5 ng/mL (ref 0.0–8.3)

## 2018-05-28 ENCOUNTER — Telehealth: Payer: Self-pay | Admitting: Oncology

## 2018-05-28 NOTE — Telephone Encounter (Signed)
R/s appt per Dr. Alen Blew due to covid 19 - pt aware of appt date and time

## 2018-05-29 ENCOUNTER — Other Ambulatory Visit: Payer: BLUE CROSS/BLUE SHIELD

## 2018-05-29 ENCOUNTER — Ambulatory Visit: Payer: BLUE CROSS/BLUE SHIELD | Admitting: Oncology

## 2018-07-23 ENCOUNTER — Other Ambulatory Visit: Payer: Self-pay

## 2018-07-23 ENCOUNTER — Inpatient Hospital Stay: Payer: BLUE CROSS/BLUE SHIELD | Attending: Oncology

## 2018-07-23 ENCOUNTER — Inpatient Hospital Stay (HOSPITAL_BASED_OUTPATIENT_CLINIC_OR_DEPARTMENT_OTHER): Payer: BLUE CROSS/BLUE SHIELD | Admitting: Oncology

## 2018-07-23 VITALS — BP 129/86 | HR 83 | Temp 98.0°F | Resp 18 | Ht 71.0 in | Wt 245.4 lb

## 2018-07-23 DIAGNOSIS — Z8547 Personal history of malignant neoplasm of testis: Secondary | ICD-10-CM | POA: Diagnosis present

## 2018-07-23 DIAGNOSIS — C6292 Malignant neoplasm of left testis, unspecified whether descended or undescended: Secondary | ICD-10-CM

## 2018-07-23 LAB — CMP (CANCER CENTER ONLY)
ALT: 38 U/L (ref 0–44)
AST: 22 U/L (ref 15–41)
Albumin: 4 g/dL (ref 3.5–5.0)
Alkaline Phosphatase: 68 U/L (ref 38–126)
Anion gap: 8 (ref 5–15)
BUN: 16 mg/dL (ref 6–20)
CO2: 25 mmol/L (ref 22–32)
Calcium: 8.6 mg/dL — ABNORMAL LOW (ref 8.9–10.3)
Chloride: 107 mmol/L (ref 98–111)
Creatinine: 1.05 mg/dL (ref 0.61–1.24)
GFR, Est AFR Am: >60 mL/min (ref >60)
GFR, Estimated: >60 mL/min (ref >60)
Glucose, Bld: 123 mg/dL — ABNORMAL HIGH (ref 70–99)
Potassium: 4.2 mmol/L (ref 3.5–5.1)
Sodium: 140 mmol/L (ref 135–145)
Total Bilirubin: 0.6 mg/dL (ref 0.3–1.2)
Total Protein: 7.1 g/dL (ref 6.5–8.1)

## 2018-07-23 LAB — CBC WITH DIFFERENTIAL (CANCER CENTER ONLY)
Abs Immature Granulocytes: 0.01 10*3/uL (ref 0.00–0.07)
Basophils Absolute: 0 10*3/uL (ref 0.0–0.1)
Basophils Relative: 1 %
Eosinophils Absolute: 0.1 10*3/uL (ref 0.0–0.5)
Eosinophils Relative: 3 %
HCT: 47.7 % (ref 39.0–52.0)
Hemoglobin: 16.5 g/dL (ref 13.0–17.0)
Immature Granulocytes: 0 %
Lymphocytes Relative: 29 %
Lymphs Abs: 0.9 10*3/uL (ref 0.7–4.0)
MCH: 30.7 pg (ref 26.0–34.0)
MCHC: 34.6 g/dL (ref 30.0–36.0)
MCV: 88.7 fL (ref 80.0–100.0)
Monocytes Absolute: 0.4 10*3/uL (ref 0.1–1.0)
Monocytes Relative: 12 %
Neutro Abs: 1.8 10*3/uL (ref 1.7–7.7)
Neutrophils Relative %: 55 %
Platelet Count: 152 10*3/uL (ref 150–400)
RBC: 5.38 MIL/uL (ref 4.22–5.81)
RDW: 12 % (ref 11.5–15.5)
WBC Count: 3.2 10*3/uL — ABNORMAL LOW (ref 4.0–10.5)
nRBC: 0 % (ref 0.0–0.2)

## 2018-07-23 LAB — LACTATE DEHYDROGENASE: LDH: 146 U/L (ref 98–192)

## 2018-07-23 NOTE — Progress Notes (Signed)
Hematology and Oncology Follow Up Visit  Barry Mercado [id number] August 27, 1965 53 y.o. 07/23/2018 8:59 AM       Principle Diagnosis: 53 year old man with left testicular cancer diagnosed in 2007.  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   Prior Therapy: 1. Underwent radical orchiectomy in 02/2006 .  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   2. He received 3 cycles of BEP chemotherapy with normalization of his tumor marker.  Therapy ended in April 2008.  Current therapy: Active surveillance.  Interim History:  Mr. Loadholt presents today for a repeat evaluation. Since her last visit, he reports no major changes in his health.  He reports increase in his appetite and weight but is trying to lose some of his weight currently.  He does report episodic back pain related to lifting heavy objects or certain mobility.  He denies any recent hospitalization or illnesses.  He denies any abdominal distention or masses.  He denied any alteration mental status, neuropathy, confusion or dizziness.  Denies any headaches or lethargy.  Denies any night sweats, weight loss or changes in appetite.  Denied orthopnea, dyspnea on exertion or chest discomfort.  Denies shortness of breath, difficulty breathing hemoptysis or cough.  Denies any abdominal distention, nausea, early satiety or dyspepsia.  Denies any hematuria, frequency, dysuria or nocturia.  Denies any skin irritation, dryness or rash.  Denies any ecchymosis or petechiae.  Denies any lymphadenopathy or clotting.  Denies any heat or cold intolerance.  Denies any anxiety or depression.  Remaining review of system is negative.         Medications: I have reviewed the patient's current medications.  Current Outpatient Medications:  .  ibuprofen (ADVIL,MOTRIN) 200 MG tablet, Take 200 mg by mouth every 6 (six) hours as needed., Disp: , Rfl:   Allergies:  Allergies  Allergen Reactions   . Penicillins Other (See Comments)    Childhood     Past Medical History, Surgical history, Social history, and Family History been unchanged on today's exam.  Physical Exam: Blood pressure 129/86, pulse 83, temperature 98 F (36.7 C), temperature source Oral, resp. rate 18, height 5\' 11"  (1.803 m), weight 245 lb 6.4 oz (111.3 kg), SpO2 97 %.    ECOG: 0    General appearance: Comfortable appearing without any discomfort Head: Normocephalic without any trauma Oropharynx: Mucous membranes are moist and pink without any thrush or ulcers. Eyes: Pupils are equal and round reactive to light. Lymph nodes: No cervical, supraclavicular, inguinal or axillary lymphadenopathy.   Heart:regular rate and rhythm.  S1 and S2 without leg edema. Lung: Clear without any rhonchi or wheezes.  No dullness to percussion. Abdomin: Soft, nontender, nondistended with good bowel sounds.  No hepatosplenomegaly. Musculoskeletal: No joint deformity or effusion.  Full range of motion noted. Neurological: No deficits noted on motor, sensory and deep tendon reflex exam. Skin: No petechial rash or dryness.  Appeared moist.  Psychiatric: Mood and affect appeared appropriate.    Lab Results: Lab Results  Component Value Date   WBC 3.3 (L) 05/23/2017   HGB 15.9 05/23/2017   HCT 46.2 05/23/2017   MCV 90.9 05/23/2017   PLT 159 05/23/2017     Chemistry      Component Value Date/Time   NA 140 05/23/2017 0859   NA 142 05/24/2016 0816   K 4.6 05/23/2017 0859   K 4.6 05/24/2016 0816   CL 108 05/23/2017 0859   CL 107 05/21/2012 0569  CO2 24 05/23/2017 0859   CO2 28 05/24/2016 0816   BUN 12 05/23/2017 0859   BUN 16.2 05/24/2016 0816   CREATININE 1.20 05/23/2017 0859   CREATININE 1.2 05/24/2016 0816      Component Value Date/Time   CALCIUM 9.5 05/23/2017 0859   CALCIUM 9.3 05/24/2016 0816   ALKPHOS 71 05/23/2017 0859   ALKPHOS 64 05/24/2016 0816   AST 26 05/23/2017 0859   AST 21 05/24/2016 0816    ALT 43 05/23/2017 0859   ALT 34 05/24/2016 0816   BILITOT 0.7 05/23/2017 0859   BILITOT 0.59 05/24/2016 0816       Impression and Plan:  53 year old man with:  1.  Left testicular cancer diagnosed in 2007.  He was found to have nonseminomatous disease initially a stage Ib but had persistent tumor markers and required chemotherapy.  He remains on active surveillance without any evidence of recurrent disease.  Laboratory testing and tumor markers were reevaluated today and so far no evidence to suggest disease relapse.  The natural course of this disease long-term was reviewed as well as risk of relapse was assessed.  After discussion today, his risk of relapse remains very low but annual visits are recommended and preferable by the patient.  2.  Survivorship: No residual toxicity related to chemotherapy including cardiovascular or pulmonary concerns.  Continue to advise him to modify his pulmonary and cardiac risk disease including diet and exercise.  3.  Age-appropriate cancer screening: He remains up-to-date at this time.  His last colonoscopy was in 2014.  4.  Follow-up: We will be in 1 year.  15  minutes was spent with the patient face-to-face today.  More than 50% of time was spent on reviewing laboratory data, disease status update and answering questions regarding future plan of care.   Zola Button, MD 5/14/20208:59 AM

## 2018-07-24 ENCOUNTER — Telehealth: Payer: Self-pay

## 2018-07-24 ENCOUNTER — Telehealth: Payer: Self-pay | Admitting: Oncology

## 2018-07-24 LAB — BETA HCG QUANT (REF LAB): hCG Quant: <1 m[IU]/mL (ref 0–3)

## 2018-07-24 LAB — AFP TUMOR MARKER: AFP, Serum, Tumor Marker: 3.1 ng/mL (ref 0.0–8.3)

## 2018-07-24 NOTE — Telephone Encounter (Signed)
Tried to reach regarding schedule °

## 2018-07-24 NOTE — Telephone Encounter (Signed)
-----   Message from [name], MD sent at 07/24/2018  8:03 AM EDT ----- Please let him know all his tumor markers are negative.

## 2018-07-24 NOTE — Telephone Encounter (Signed)
Contacted patient and made aware of results. 

## 2018-10-28 ENCOUNTER — Emergency Department (HOSPITAL_COMMUNITY)
Admission: EM | Admit: 2018-10-28 | Discharge: 2018-10-28 | Disposition: A | Discharge status: Home / Self Care | Payer: BC Managed Care – PPO | Attending: Emergency Medicine | Admitting: Emergency Medicine

## 2018-10-28 ENCOUNTER — Emergency Department (HOSPITAL_COMMUNITY): Payer: BC Managed Care – PPO

## 2018-10-28 ENCOUNTER — Other Ambulatory Visit: Payer: Self-pay

## 2018-10-28 ENCOUNTER — Encounter (HOSPITAL_COMMUNITY): Payer: Self-pay

## 2018-10-28 DIAGNOSIS — R1110 Vomiting, unspecified: Secondary | ICD-10-CM | POA: Insufficient documentation

## 2018-10-28 DIAGNOSIS — Z8547 Personal history of malignant neoplasm of testis: Secondary | ICD-10-CM | POA: Diagnosis not present

## 2018-10-28 DIAGNOSIS — R1012 Left upper quadrant pain: Secondary | ICD-10-CM | POA: Diagnosis present

## 2018-10-28 DIAGNOSIS — K8050 Calculus of bile duct without cholangitis or cholecystitis without obstruction: Secondary | ICD-10-CM | POA: Diagnosis not present

## 2018-10-28 LAB — COMPREHENSIVE METABOLIC PANEL
ALT: 50 U/L — ABNORMAL HIGH (ref 0–44)
AST: 29 U/L (ref 15–41)
Albumin: 5 g/dL (ref 3.5–5.0)
Alkaline Phosphatase: 65 U/L (ref 38–126)
Anion gap: 11 (ref 5–15)
BUN: 22 mg/dL — ABNORMAL HIGH (ref 6–20)
CO2: 25 mmol/L (ref 22–32)
Calcium: 9.6 mg/dL (ref 8.9–10.3)
Chloride: 105 mmol/L (ref 98–111)
Creatinine, Ser: 1.05 mg/dL (ref 0.61–1.24)
GFR calc Af Amer: >60 mL/min (ref >60)
GFR calc non Af Amer: >60 mL/min (ref >60)
Glucose, Bld: 162 mg/dL — ABNORMAL HIGH (ref 70–99)
Potassium: 4.2 mmol/L (ref 3.5–5.1)
Sodium: 141 mmol/L (ref 135–145)
Total Bilirubin: 1.1 mg/dL (ref 0.3–1.2)
Total Protein: 8.3 g/dL — ABNORMAL HIGH (ref 6.5–8.1)

## 2018-10-28 LAB — URINALYSIS, ROUTINE W REFLEX MICROSCOPIC
Bilirubin Urine: NEGATIVE
Glucose, UA: NEGATIVE mg/dL
Hgb urine dipstick: NEGATIVE
Ketones, ur: 20 mg/dL — AB
Leukocytes,Ua: NEGATIVE
Nitrite: NEGATIVE
Protein, ur: NEGATIVE mg/dL
Specific Gravity, Urine: 1.044 — ABNORMAL HIGH (ref 1.005–1.030)
pH: 6 (ref 5.0–8.0)

## 2018-10-28 LAB — CBC
HCT: 51.7 % (ref 39.0–52.0)
Hemoglobin: 18.4 g/dL — ABNORMAL HIGH (ref 13.0–17.0)
MCH: 31.8 pg (ref 26.0–34.0)
MCHC: 35.6 g/dL (ref 30.0–36.0)
MCV: 89.3 fL (ref 80.0–100.0)
Platelets: 182 10*3/uL (ref 150–400)
RBC: 5.79 MIL/uL (ref 4.22–5.81)
RDW: 12.6 % (ref 11.5–15.5)
WBC: 9.4 10*3/uL (ref 4.0–10.5)
nRBC: 0 % (ref 0.0–0.2)

## 2018-10-28 LAB — LIPASE, BLOOD: Lipase: 57 U/L — ABNORMAL HIGH (ref 11–51)

## 2018-10-28 MED ORDER — MORPHINE SULFATE (PF) 4 MG/ML IV SOLN
4.0000 mg | Freq: Once | INTRAVENOUS | Status: AC
  Administered 2018-10-28: 4 mg via INTRAVENOUS
  Filled 2018-10-28: qty 1

## 2018-10-28 MED ORDER — HYDROMORPHONE HCL 1 MG/ML IJ SOLN
1.0000 mg | Freq: Once | INTRAMUSCULAR | Status: AC
  Administered 2018-10-28: 1 mg via INTRAVENOUS
  Filled 2018-10-28: qty 1

## 2018-10-28 MED ORDER — SODIUM CHLORIDE 0.9% FLUSH
3.0000 mL | Freq: Once | INTRAVENOUS | Status: AC
  Administered 2018-10-28: 3 mL via INTRAVENOUS

## 2018-10-28 MED ORDER — OXYCODONE-ACETAMINOPHEN 5-325 MG PO TABS: 1.0000 | ORAL_TABLET | ORAL | 0 refills | Status: DC | PRN

## 2018-10-28 MED ORDER — ONDANSETRON HCL 4 MG/2ML IJ SOLN
4.0000 mg | Freq: Once | INTRAMUSCULAR | Status: AC
  Administered 2018-10-28: 4 mg via INTRAVENOUS
  Filled 2018-10-28: qty 2

## 2018-10-28 MED ORDER — IOHEXOL 300 MG/ML  SOLN
100.0000 mL | Freq: Once | INTRAMUSCULAR | Status: AC | PRN
  Administered 2018-10-28: 100 mL via INTRAVENOUS

## 2018-10-28 MED ORDER — ONDANSETRON 4 MG PO TBDP: ORAL_TABLET | ORAL | 0 refills | Status: DC

## 2018-10-28 MED ORDER — SODIUM CHLORIDE 0.9 % IV BOLUS
1000.0000 mL | Freq: Once | INTRAVENOUS | Status: AC
  Administered 2018-10-28: 1000 mL via INTRAVENOUS

## 2018-10-28 MED ORDER — SODIUM CHLORIDE (PF) 0.9 % IJ SOLN
INTRAMUSCULAR | Status: AC
  Filled 2018-10-28: qty 50

## 2018-10-28 NOTE — ED Triage Notes (Signed)
Per EMS- patient  c/o LUQ pain and emesis x 4 since 0500 today.

## 2018-10-28 NOTE — ED Provider Notes (Signed)
[facility] DEPT Provider Note   CSN: [id number] Arrival date & time: 10/28/18  1158    History   Chief Complaint Chief Complaint  Patient presents with   Abdominal Pain   Emesis    HPI Barry Mercado is a 53 y.o. male.     HPI Patient presents with left upper quadrant abdominal pain which started this morning at 5 AM.  States the pain does not radiate.  Associated with multiple episodes of vomiting.  Denies diarrhea.  No fever or chills.  No blood in vomit or stool.  Denies urinary symptoms including hesitancy, hematuria or frequency.  No previous abdominal surgeries.  Denies any recent NSAID use. Past Medical History:  Diagnosis Date   Cancer [facility])    testicular ca    Patient Active Problem List   Diagnosis Date Noted   Testis cancer ([place]) 05/21/2013    History reviewed. No pertinent surgical history.      Home Medications    Prior to Admission medications   Medication Sig Start Date End Date Taking? Authorizing Provider  aspirin 325 MG tablet Take 325 mg by mouth every 6 (six) hours as needed for mild pain.   Yes [provider]  ibuprofen (ADVIL,MOTRIN) 200 MG tablet Take 200 mg by mouth every 6 (six) hours as needed.   Yes [provider]  naproxen sodium (ALEVE) 220 MG tablet Take 220 mg by mouth daily as needed (pain).   Yes [provider]  ondansetron (ZOFRAN ODT) 4 MG disintegrating tablet 4mg  ODT q4 hours prn nausea/vomit 10/28/18   Julianne Rice, MD  oxyCODONE-acetaminophen (PERCOCET) 5-325 MG tablet Take 1-2 tablets by mouth every 4 (four) hours as needed for severe pain. 10/28/18   Julianne Rice, MD    Family History Family History  Problem Relation Age of Onset   COPD Father     Social History Social History   Tobacco Use   Smoking status: Never Smoker   Smokeless tobacco: Never Used  Substance Use Topics   Alcohol use: No   Drug use: No     Allergies     Penicillins   Review of Systems Review of Systems  Constitutional: Negative for chills and fever.  HENT: Negative for sore throat and trouble swallowing.   Eyes: Negative for visual disturbance.  Respiratory: Negative for cough and shortness of breath.   Cardiovascular: Negative for chest pain.  Gastrointestinal: Positive for abdominal pain, nausea and vomiting. Negative for constipation.  Genitourinary: Negative for dysuria, flank pain, frequency and hematuria.  Musculoskeletal: Negative for back pain, myalgias and neck pain.  Skin: Negative for rash and wound.  Neurological: Negative for dizziness, weakness, light-headedness, numbness and headaches.  All other systems reviewed and are negative.    Physical Exam Updated Vital Signs BP (!) 180/95    Pulse 60    Temp 97.6 F (36.4 C)    Resp 20    Ht 5\' 11"  (1.803 m)    Wt 111.1 kg    SpO2 98%    BMI 34.17 kg/m   Physical Exam Vitals signs and nursing note reviewed.  Constitutional:      Appearance: He is well-developed.     Comments: Appears uncomfortable  HENT:     Head: Normocephalic and atraumatic.     Nose: Nose normal.     Mouth/Throat:     Mouth: Mucous membranes are moist.  Eyes:     Pupils: Pupils are equal, round, and reactive to light.  Neck:     Musculoskeletal: Normal range of motion and neck supple.  Cardiovascular:     Rate and Rhythm: Normal rate and regular rhythm.     Heart sounds: No murmur. No friction rub. No gallop.   Pulmonary:     Effort: Pulmonary effort is normal. No respiratory distress.     Breath sounds: Normal breath sounds. No stridor. No wheezing, rhonchi or rales.  Chest:     Chest wall: No tenderness.  Abdominal:     General: Bowel sounds are normal. There is no distension.     Palpations: Abdomen is soft. There is no mass.     Tenderness: There is abdominal tenderness. There is no right CVA tenderness, left CVA tenderness, guarding or rebound.     Hernia: No hernia is present.      Comments: Mild left upper quadrant tenderness to palpation.  No rebound or guarding.  Musculoskeletal: Normal range of motion.        General: No swelling, tenderness, deformity or signs of injury.     Right lower leg: No edema.     Left lower leg: No edema.  Skin:    General: Skin is warm and dry.     Capillary Refill: Capillary refill takes less than 2 seconds.     Findings: No erythema or rash.  Neurological:     General: No focal deficit present.     Mental Status: He is alert and oriented to person, place, and time.  Psychiatric:        Behavior: Behavior normal.      ED Treatments / Results  Labs (all labs ordered are listed, but only abnormal results are displayed) Labs Reviewed  LIPASE, BLOOD - Abnormal; Notable for the following components:      Result Value   Lipase 57 (*)    All other components within normal limits  COMPREHENSIVE METABOLIC PANEL - Abnormal; Notable for the following components:   Glucose, Bld 162 (*)    BUN 22 (*)    Total Protein 8.3 (*)    ALT 50 (*)    All other components within normal limits  CBC - Abnormal; Notable for the following components:   Hemoglobin 18.4 (*)    All other components within normal limits  URINALYSIS, ROUTINE W REFLEX MICROSCOPIC - Abnormal; Notable for the following components:   Specific Gravity, Urine 1.044 (*)    Ketones, ur 20 (*)    All other components within normal limits    EKG None  Radiology Ct Abdomen Pelvis W Contrast  Result Date: 10/28/2018 CLINICAL DATA:  Abdominal pain, LEFT upper quadrant pain and vomiting since 0500 hours today history testicular cancer EXAM: CT ABDOMEN AND PELVIS WITH CONTRAST TECHNIQUE: Multidetector CT imaging of the abdomen and pelvis was performed using the standard protocol following bolus administration of intravenous contrast. Sagittal and coronal MPR images reconstructed from axial data set. CONTRAST:  14mL OMNIPAQUE IOHEXOL 300 MG/ML SOLN IV. No oral contrast.  COMPARISON:  09/20/2008 FINDINGS: Lower chest: Minimal dependent atelectasis at lung bases Hepatobiliary: Multiple calcified gallstones in gallbladder up to 10 mm. Two gallstones at the gallbladder neck versus cystic duct. No gallbladder wall thickening or pericholecystic infiltration. Nonspecific 5 mm low-attenuation focus lateral segment LEFT lobe liver, previously 4 mm. Gallbladder and liver otherwise normal appearance. Pancreas: Normal appearance Spleen: Normal appearance Adrenals/Urinary Tract: Adrenal glands, kidneys, ureters, and bladder normal appearance Stomach/Bowel: Normal appendix. Stomach and bowel loops normal appearance. Vascular/Lymphatic: Vascular structures patent. Aorta normal  caliber. No adenopathy. Reproductive: Absent LEFT spermatic cord in inguinal canal. Other: No free air free fluid. Small umbilical hernia containing fat. No inflammatory process. Musculoskeletal: Unremarkable IMPRESSION: Cholelithiasis with 2 tiny calcifications at either the gallbladder neck or cystic duct, without CT evidence of acute cholecystitis; recommend correlation with LFTs. Small umbilical hernia containing fat. No other intra-abdominal or intrapelvic abnormalities identified. Electronically Signed   By: Lavonia Dana M.D.   On: 10/28/2018 14:59   US Abdomen Limited  Result Date: 10/28/2018 CLINICAL DATA:  53 year old male with right upper quadrant abdominal pain. EXAM: ULTRASOUND ABDOMEN LIMITED RIGHT UPPER QUADRANT COMPARISON:  CT of the abdomen pelvis dated 10/28/2018 FINDINGS: Gallbladder: There multiple stones in the gallbladder. No gallbladder wall thickening or pericholecystic fluid. Negative sonographic Murphy's sign. Common bile duct: Diameter: 3 mm Liver: Diffuse increased liver echogenicity most consistent with fatty infiltration. Superimposed inflammation or fibrosis is not excluded. Portal vein is patent on color Doppler imaging with normal direction of blood flow towards the liver. Other: None.  IMPRESSION: 1. Cholelithiasis without sonographic evidence of acute cholecystitis. 2. Probable mild fatty liver. Electronically Signed   By: Anner Crete M.D.   On: 10/28/2018 16:01    Procedures Procedures (including critical care time)  Medications Ordered in ED Medications  sodium chloride (PF) 0.9 % injection (has no administration in time range)  sodium chloride flush (NS) 0.9 % injection 3 mL (3 mLs Intravenous Given 10/28/18 1319)  morphine 4 MG/ML injection 4 mg (4 mg Intravenous Given 10/28/18 1319)  ondansetron (ZOFRAN) injection 4 mg (4 mg Intravenous Given 10/28/18 1318)  sodium chloride 0.9 % bolus 1,000 mL (1,000 mLs Intravenous New Bag/Given 10/28/18 1353)  iohexol (OMNIPAQUE) 300 MG/ML solution 100 mL (100 mLs Intravenous Contrast Given 10/28/18 1429)  HYDROmorphone (DILAUDID) injection 1 mg (1 mg Intravenous Given 10/28/18 1526)     Initial Impression / Assessment and Plan / ED Course  I have reviewed the triage vital signs and the nursing notes.  Pertinent labs & imaging results that were available during my care of the patient were reviewed by me and considered in my medical decision making (see chart for details).        Patient's pain has completely resolved.  Ultrasound with evidence of cholelithiasis but no evidence of cholecystitis.  Patient notes eating a lot of fatty foods yesterday evening.  Will give general surgery follow-up and strict return precautions.  Final Clinical Impressions(s) / ED Diagnoses   Final diagnoses:  Biliary colic    ED Discharge Orders         Ordered    oxyCODONE-acetaminophen (PERCOCET) 5-325 MG tablet  Every 4 hours PRN     10/28/18 1630    ondansetron (ZOFRAN ODT) 4 MG disintegrating tablet     10/28/18 1630           Julianne Rice, MD 10/28/18 1631

## 2018-11-07 ENCOUNTER — Ambulatory Visit: Payer: Self-pay | Admitting: General Surgery

## 2018-11-07 NOTE — H&P (Signed)
Barry Mercado Documented: 11/05/2018 9:39 AM Location: [facility] Patient #: [id number] DOB: June 29, 1965 Married / Language: Barry Mercado / Race: White Male   History of Present Illness [name] M. Jessicalynn Deshong MD; 11/05/2018 10:44 AM) The patient is a 53 year old male who presents for evaluation of gall stones. he is referred by Dr Lita Mains for evaluation of epigastric pain and left upper quadrant pain. He reports that he awoke the other day with pressure in his epigastrium. It was quite intense and it did not get better so after about 7 hours he went to the emergency room. He was in so much discomfort he was sweating causing nausea and vomiting. He only got relief with the IV pain medicine. He did take some Pepcid at home that morning without any improvement. In the emergency room he had a CT scan and ultrasound. He was found to have a fat-containing umbilical hernia and gallstones. Ultrasound showed fatty liver and gallstones. Labs were unremarkable except for an ALT of 50. He reports that over the years he had several episodes like that where he had epigastric and left upper quadrant pain lasting generally less than 6 hours. He describes the discomfort as a twisting sensation. He denies any heartburn, indigestion, burning sensation in his chest or early satiety. No frequent NSAID use. No prior abdominal surgeries. No prior blood clots. Normal stool pattern.   Problem List/Past Medical [name] M. Redmond Pulling, MD; 11/05/2018 10:45 AM) SYMPTOMATIC CHOLELITHIASIS ([id number])  UMBILICAL HERNIA WITHOUT OBSTRUCTION OR GANGRENE (K42.9)  FATTY LIVER (K76.0)   Past Surgical History [name] M. Redmond Pulling, MD; 11/05/2018 10:40 AM) Vasectomy   Diagnostic Studies History Leighton Ruff. Redmond Pulling, MD; 11/05/2018 10:40 AM) Colonoscopy  1-5 years ago  Allergies (April Staton, [place]; 11/05/2018 9:40 AM) Penicillins   Medication History (April Staton, [place]; 11/05/2018 9:40 AM) No Current Medications Medications  Reconciled  Social History [name] M. Redmond Pulling, MD; 11/05/2018 10:40 AM) Alcohol use  Occasional alcohol use. Caffeine use  Tea. No drug use  Tobacco use  Never smoker.  Family History [name] M. Redmond Pulling, MD; 11/05/2018 10:40 AM) Arthritis  Father.  Other Problems [name] M. Redmond Pulling, MD; 11/05/2018 10:45 AM) Cancer  Cholelithiasis     Review of Systems [name] M. Laketha Leopard MD; 11/05/2018 10:40 AM) General Not Present- Appetite Loss, Chills, Fatigue, Fever, Night Sweats, Weight Gain and Weight Loss. Skin Not Present- Change in Wart/Mole, Dryness, Hives, Jaundice, New Lesions, Non-Healing Wounds, Rash and Ulcer. HEENT Not Present- Earache, Hearing Loss, Hoarseness, Nose Bleed, Oral Ulcers, Ringing in the Ears, Seasonal Allergies, Sinus Pain, Sore Throat, Visual Disturbances, Wears glasses/contact lenses and Yellow Eyes. Respiratory Not Present- Bloody sputum, Chronic Cough, Difficulty Breathing, Snoring and Wheezing. Breast Not Present- Breast Mass, Breast Pain, Nipple Discharge and Skin Changes. Cardiovascular Not Present- Chest Pain, Difficulty Breathing Lying Down, Leg Cramps, Palpitations, Rapid Heart Rate, Shortness of Breath and Swelling of Extremities. Gastrointestinal Not Present- Abdominal Pain, Bloating, Bloody Stool, Change in Bowel Habits, Chronic diarrhea, Constipation, Difficulty Swallowing, Excessive gas, Gets full quickly at meals, Hemorrhoids, Indigestion, Nausea, Rectal Pain and Vomiting. Male Genitourinary Not Present- Blood in Urine, Change in Urinary Stream, Frequency, Impotence, Nocturia, Painful Urination, Urgency and Urine Leakage. Musculoskeletal Not Present- Back Pain, Joint Pain, Joint Stiffness, Muscle Pain, Muscle Weakness and Swelling of Extremities. Neurological Not Present- Decreased Memory, Fainting, Headaches, Numbness, Seizures, Tingling, Tremor, Trouble walking and Weakness. Psychiatric Not Present- Anxiety, Bipolar, Change in Sleep Pattern, Depression, Fearful and  Frequent crying. Endocrine Not Present- Cold Intolerance, Excessive Hunger, Hair Changes,  Heat Intolerance, Hot flashes and New Diabetes. Hematology Not Present- Blood Thinners, Easy Bruising, Excessive bleeding, Gland problems, HIV and Persistent Infections.  Vitals (April Staton CMA; 11/05/2018 9:41 AM) 11/05/2018 9:40 AM Weight: 246 lb Height: 71in Body Surface Area: 2.3 m Body Mass Index: 34.31 kg/m  Temp.: 97.93F (Oral)  Pulse: 79 (Regular)  BP: 110/82(Sitting, Left Arm, Standard)       Physical Exam [name] M. Azlan Hanway MD; 11/05/2018 10:44 AM) General Mental Status-Alert. General Appearance-Consistent with stated age. Hydration-Well hydrated. Voice-Normal.  Head and Neck Head-normocephalic, atraumatic with no lesions or palpable masses. Trachea-midline. Thyroid Gland Characteristics - normal size and consistency.  Eye Eyeball - Bilateral-Extraocular movements intact. Sclera/Conjunctiva - Bilateral-No scleral icterus.  Chest and Lung Exam Chest and lung exam reveals -quiet, even and easy respiratory effort with no use of accessory muscles and on auscultation, normal breath sounds, no adventitious sounds and normal vocal resonance. Inspection Chest Wall - Normal. Back - normal.  Breast - Did not examine.  Cardiovascular Cardiovascular examination reveals -normal heart sounds, regular rate and rhythm with no murmurs and normal pedal pulses bilaterally.  Abdomen Inspection  Inspection of the abdomen reveals: Note: About a 2 cm fat-containing umbilical hernia. Skin - Scar - no surgical scars. Palpation/Percussion Palpation and Percussion of the abdomen reveal - Soft, Non Tender, No Rebound tenderness, No Rigidity (guarding) and No hepatosplenomegaly. Auscultation Auscultation of the abdomen reveals - Bowel sounds normal.  Peripheral Vascular Upper Extremity Palpation - Pulses bilaterally normal.  Neurologic Neurologic evaluation  reveals -alert and oriented x 3 with no impairment of recent or remote memory. Mental Status-Normal.  Neuropsychiatric The patient's mood and affect are described as -normal. Judgment and Insight-insight is appropriate concerning matters relevant to self.  Musculoskeletal Normal Exam - Left-Upper Extremity Strength Normal and Lower Extremity Strength Normal. Normal Exam - Right-Upper Extremity Strength Normal and Lower Extremity Strength Normal.  Lymphatic Head & Neck  General Head & Neck Lymphatics: Bilateral - Description - Normal. Axillary - Did not examine. Femoral & Inguinal - Did not examine.    Assessment & Plan [name] M. Ozetta Flatley MD; 11/05/2018 10:45 AM)  SYMPTOMATIC CHOLELITHIASIS (K80.20) Impression: I believe the patient's symptoms are consistent with gallbladder disease. The only thing that is atypical is that some of his pain is in his left upper quadrant. However he has no indigestion, heartburn or reflux symptoms so I still believe his entire clinical picture is consistent with symptomatic cholelithiasis. We did discuss evaluation by gastroenterology with possible endoscopy unit after further discussion we both decided on proceeding with cholecystectomy  We discussed gallbladder disease. The patient was given Neurosurgeon. We discussed non-operative and operative management. We discussed the signs & symptoms of acute cholecystitis  I discussed laparoscopic cholecystectomy with IOC in detail. The patient was given educational material as well as diagrams detailing the procedure. We discussed the risks and benefits of a laparoscopic cholecystectomy including, but not limited to bleeding, infection, injury to surrounding structures such as the intestine or liver, bile leak, retained gallstones, need to convert to an open procedure, prolonged diarrhea, blood clots such as DVT, common bile duct injury, anesthesia risks, and possible need for additional procedures.  We discussed the typical post-operative recovery course. I explained that the likelihood of improvement of their symptoms is good.  The patient has elected to proceed with surgery.  Current Plans Pt Education - Pamphlet Given - Laparoscopic Gallbladder Surgery: discussed with patient and provided information. You are being scheduled for surgery- Our schedulers will  call you.  You should hear from our office's scheduling department within 5 working days about the location, date, and time of surgery. We try to make accommodations for patient's preferences in scheduling surgery, but sometimes the OR schedule or the surgeon's schedule prevents [place] from making those accommodations.  If you have not heard from our office [phone] in 5 working days, call the office and ask for your surgeon's nurse.  If you have other questions about your diagnosis, plan, or surgery, call the office and ask for your surgeon's nurse.   UMBILICAL HERNIA WITHOUT OBSTRUCTION OR GANGRENE (K42.9) Impression: Advised him that we will use the fascial defect at his umbilical hernia for gallbladder extraction and we'll place several additional fascial sutures but we will not use permanent mesh   FATTY LIVER (K76.0) Impression: I believe his ALT of 50 in addition to his radiological findings are more consistent with fatty liver then his ALT of 50 representing gallbladder event that evening. We discussed that the best treatment for fatty liver is weight loss.  Leighton Ruff. Redmond Pulling, MD, FACS General, Bariatric, & Minimally Invasive Surgery [facility], [place]

## 2018-12-17 NOTE — Patient Instructions (Signed)
DUE TO COVID-19 ONLY ONE VISITOR IS ALLOWED TO COME WITH YOU AND STAY IN THE WAITING ROOM ONLY DURING PRE OP AND PROCEDURE DAY OF SURGERY. THE 1 VISITOR MAY VISIT WITH YOU AFTER SURGERY IN YOUR PRIVATE ROOM DURING VISITING HOURS ONLY!  YOU NEED TO HAVE A COVID 19 TEST ON_______ @_______ , THIS TEST MUST BE DONE BEFORE SURGERY, COME  [address] , [id number].  ([place]) ONCE YOUR COVID TEST IS COMPLETED, PLEASE BEGIN THE QUARANTINE INSTRUCTIONS AS OUTLINED IN YOUR HANDOUT.                EHRIN ARRIZON  12/17/2018   Your procedure is scheduled on: 01-01-19   Report to [facility] Main  Entrance   Report to short stay  at        0530 AM     Call this number if you have problems the morning of surgery [phone]    Remember: Do not eat food or drink liquids :After Midnight.   BRUSH YOUR TEETH MORNING OF SURGERY AND RINSE YOUR MOUTH OUT, NO CHEWING GUM CANDY OR MINTS.     Take these medicines the morning of surgery with A SIP OF WATER: flonase if needed                                 You may not have any metal on your body including hair pins and              piercings  Do not wear jewelry,, lotions, powders or perfumes, deodorant                         Men may shave face and neck.   Do not bring valuables to the hospital. [place].  Contacts, dentures or bridgework may not be worn into surgery.       Patients discharged the day of surgery will not be allowed to drive home. IF YOU ARE HAVING SURGERY AND GOING HOME THE SAME DAY, YOU MUST HAVE AN ADULT TO DRIVE YOU HOME AND BE WITH YOU FOR 24 HOURS. YOU MAY GO HOME BY TAXI OR UBER OR ORTHERWISE, BUT AN ADULT MUST ACCOMPANY YOU HOME AND STAY WITH YOU FOR 24 HOURS.  Name and phone number of your driver:  Special Instructions: N/A              Please read over the following fact sheets you were  given: _____________________________________________________________________             [facility] - Preparing for Surgery Before surgery, you can play an important role.  Because skin is not sterile, your skin needs to be as free of germs as possible.  You can reduce the number of germs on your skin by washing with CHG (chlorahexidine gluconate) soap before surgery.  CHG is an antiseptic cleaner which kills germs and bonds with the skin to continue killing germs even after washing. Please DO NOT use if you have an allergy to CHG or antibacterial soaps.  If your skin becomes reddened/irritated stop using the CHG and inform your nurse when you arrive at Short Stay. Do not shave (including legs and underarms) for at least 48 hours prior to the first CHG shower.  You may  shave your face/neck. Please follow these instructions carefully:  1.  Shower with CHG Soap the night before surgery and the  morning of Surgery.  2.  If you choose to wash your hair, wash your hair first as usual with your  normal  shampoo.  3.  After you shampoo, rinse your hair and body thoroughly to remove the  shampoo.                           4.  Use CHG as you would any other liquid soap.  You can apply chg directly  to the skin and wash                       Gently with a scrungie or clean washcloth.  5.  Apply the CHG Soap to your body ONLY FROM THE NECK DOWN.   Do not use on face/ open                           Wound or open sores. Avoid contact with eyes, ears mouth and genitals (private parts).                       Wash face,  Genitals (private parts) with your normal soap.             6.  Wash thoroughly, paying special attention to the area where your surgery  will be performed.  7.  Thoroughly rinse your body with warm water from the neck down.  8.  DO NOT shower/wash with your normal soap after using and rinsing off  the CHG Soap.                9.  Pat yourself dry with a clean towel.            10.  Wear  clean pajamas.            11.  Place clean sheets on your bed the night of your first shower and do not  sleep with pets. Day of Surgery : Do not apply any lotions/deodorants the morning of surgery.  Please wear clean clothes to the hospital/surgery center.  FAILURE TO FOLLOW THESE INSTRUCTIONS MAY RESULT IN THE CANCELLATION OF YOUR SURGERY PATIENT SIGNATURE_________________________________  NURSE SIGNATURE__________________________________  ________________________________________________________________________

## 2018-12-21 ENCOUNTER — Other Ambulatory Visit: Payer: Self-pay

## 2018-12-21 ENCOUNTER — Encounter (HOSPITAL_COMMUNITY)
Admission: RE | Admit: 2018-12-21 | Discharge: 2018-12-21 | Disposition: A | Discharge status: Home / Self Care | Payer: BC Managed Care – PPO | Source: Ambulatory Visit | Attending: General Surgery | Admitting: General Surgery

## 2018-12-21 ENCOUNTER — Encounter (HOSPITAL_COMMUNITY): Payer: Self-pay

## 2018-12-21 DIAGNOSIS — K8020 Calculus of gallbladder without cholecystitis without obstruction: Secondary | ICD-10-CM | POA: Insufficient documentation

## 2018-12-21 DIAGNOSIS — Z01812 Encounter for preprocedural laboratory examination: Secondary | ICD-10-CM | POA: Diagnosis present

## 2018-12-21 LAB — CBC
HCT: 46.7 % (ref 39.0–52.0)
Hemoglobin: 16.3 g/dL (ref 13.0–17.0)
MCH: 31.2 pg (ref 26.0–34.0)
MCHC: 34.9 g/dL (ref 30.0–36.0)
MCV: 89.5 fL (ref 80.0–100.0)
Platelets: 163 10*3/uL (ref 150–400)
RBC: 5.22 MIL/uL (ref 4.22–5.81)
RDW: 12 % (ref 11.5–15.5)
WBC: 3.4 10*3/uL — ABNORMAL LOW (ref 4.0–10.5)
nRBC: 0 % (ref 0.0–0.2)

## 2018-12-21 NOTE — Progress Notes (Signed)
PCP - Carolann Littler Cardiologist -   Chest x-ray -  EKG -  Stress Test -  ECHO -  Cardiac Cath -   Sleep Study -  CPAP -   Fasting Blood Sugar -  Checks Blood Sugar _____ times a day  Blood Thinner Instructions: Aspirin Instructions: Last Dose:  Anesthesia review:   Patient denies shortness of breath, fever, cough and chest pain at PAT appointment   none   Patient verbalized understanding of instructions that were given to them at the PAT appointment. Patient was also instructed that they will need to review over the PAT instructions again at home before surgery.

## 2018-12-29 ENCOUNTER — Other Ambulatory Visit (HOSPITAL_COMMUNITY)
Admission: RE | Admit: 2018-12-29 | Discharge: 2018-12-29 | Disposition: A | Discharge status: Home / Self Care | Payer: BC Managed Care – PPO | Source: Ambulatory Visit | Attending: General Surgery | Admitting: General Surgery

## 2018-12-29 DIAGNOSIS — Z20828 Contact with and (suspected) exposure to other viral communicable diseases: Secondary | ICD-10-CM | POA: Diagnosis not present

## 2018-12-29 DIAGNOSIS — Z01812 Encounter for preprocedural laboratory examination: Secondary | ICD-10-CM | POA: Diagnosis present

## 2018-12-30 LAB — NOVEL CORONAVIRUS, NAA (HOSP ORDER, SEND-OUT TO REF LAB; TAT 18-24 HRS): SARS-CoV-2, NAA: NOT DETECTED

## 2018-12-31 NOTE — Anesthesia Preprocedure Evaluation (Addendum)
Anesthesia Evaluation  Patient identified by MRN, date of birth, ID band Patient awake    Reviewed: Allergy & Precautions, NPO status   Airway Mallampati: II       Dental   Pulmonary    breath sounds clear to auscultation       Cardiovascular negative cardio ROS   Rhythm:Regular Rate:Normal     Neuro/Psych    GI/Hepatic negative GI ROS, Neg liver ROS,   Endo/Other    Renal/GU negative Renal ROS     Musculoskeletal   Abdominal   Peds  Hematology   Anesthesia Other Findings   Reproductive/Obstetrics                           Anesthesia Physical Anesthesia Plan  ASA: II  Anesthesia Plan: General   Post-op Pain Management:    Induction: Intravenous  PONV Risk Score and Plan: 2 and Ondansetron, Dexamethasone and Midazolam  Airway Management Planned: Oral ETT  Additional Equipment:   Intra-op Plan:   Post-operative Plan: Extubation in OR  Informed Consent: I have reviewed the patients History and Physical, chart, labs and discussed the procedure including the risks, benefits and alternatives for the proposed anesthesia with the patient or authorized representative who has indicated his/her understanding and acceptance.     Dental advisory given  Plan Discussed with: Anesthesiologist and CRNA  Anesthesia Plan Comments:       Anesthesia Quick Evaluation

## 2019-01-01 ENCOUNTER — Ambulatory Visit (HOSPITAL_COMMUNITY): Payer: BC Managed Care – PPO

## 2019-01-01 ENCOUNTER — Ambulatory Visit (HOSPITAL_COMMUNITY): Payer: BC Managed Care – PPO | Admitting: Anesthesiology

## 2019-01-01 ENCOUNTER — Encounter (HOSPITAL_COMMUNITY): Payer: Self-pay | Admitting: *Deleted

## 2019-01-01 ENCOUNTER — Encounter (HOSPITAL_COMMUNITY)
Admission: RE | Disposition: A | Discharge status: Home / Self Care | Payer: Self-pay | Source: Home / Self Care | Attending: General Surgery

## 2019-01-01 ENCOUNTER — Other Ambulatory Visit: Payer: Self-pay

## 2019-01-01 ENCOUNTER — Ambulatory Visit (HOSPITAL_COMMUNITY)
Admission: RE | Admit: 2019-01-01 | Discharge: 2019-01-01 | Disposition: A | Discharge status: Home / Self Care | Payer: BC Managed Care – PPO | Attending: General Surgery | Admitting: General Surgery

## 2019-01-01 ENCOUNTER — Ambulatory Visit (HOSPITAL_COMMUNITY): Payer: BC Managed Care – PPO | Admitting: Physician Assistant

## 2019-01-01 DIAGNOSIS — K429 Umbilical hernia without obstruction or gangrene: Secondary | ICD-10-CM | POA: Diagnosis not present

## 2019-01-01 DIAGNOSIS — Z79899 Other long term (current) drug therapy: Secondary | ICD-10-CM | POA: Insufficient documentation

## 2019-01-01 DIAGNOSIS — Z419 Encounter for procedure for purposes other than remedying health state, unspecified: Secondary | ICD-10-CM

## 2019-01-01 DIAGNOSIS — K8010 Calculus of gallbladder with chronic cholecystitis without obstruction: Secondary | ICD-10-CM | POA: Diagnosis present

## 2019-01-01 DIAGNOSIS — Z880 Allergy status to penicillin: Secondary | ICD-10-CM | POA: Diagnosis not present

## 2019-01-01 DIAGNOSIS — Z8547 Personal history of malignant neoplasm of testis: Secondary | ICD-10-CM | POA: Insufficient documentation

## 2019-01-01 DIAGNOSIS — Z791 Long term (current) use of non-steroidal anti-inflammatories (NSAID): Secondary | ICD-10-CM | POA: Diagnosis not present

## 2019-01-01 DIAGNOSIS — K760 Fatty (change of) liver, not elsewhere classified: Secondary | ICD-10-CM | POA: Diagnosis not present

## 2019-01-01 HISTORY — PX: CHOLECYSTECTOMY: SHX55

## 2019-01-01 SURGERY — LAPAROSCOPIC CHOLECYSTECTOMY WITH INTRAOPERATIVE CHOLANGIOGRAM
Anesthesia: General

## 2019-01-01 MED ORDER — BUPIVACAINE-EPINEPHRINE 0.25% -1:200000 IJ SOLN
INTRAMUSCULAR | Status: DC | PRN
  Administered 2019-01-01: 30 mL

## 2019-01-01 MED ORDER — DEXAMETHASONE SODIUM PHOSPHATE 10 MG/ML IJ SOLN
INTRAMUSCULAR | Status: DC | PRN
  Administered 2019-01-01: 10 mg via INTRAVENOUS

## 2019-01-01 MED ORDER — SUGAMMADEX SODIUM 200 MG/2ML IV SOLN
INTRAVENOUS | Status: DC | PRN
  Administered 2019-01-01: 200 mg via INTRAVENOUS

## 2019-01-01 MED ORDER — 0.9 % SODIUM CHLORIDE (POUR BTL) OPTIME
TOPICAL | Status: DC | PRN
  Administered 2019-01-01: 1000 mL

## 2019-01-01 MED ORDER — LIDOCAINE 2% (20 MG/ML) 5 ML SYRINGE
INTRAMUSCULAR | Status: AC
  Filled 2019-01-01: qty 5

## 2019-01-01 MED ORDER — GABAPENTIN 300 MG PO CAPS
300.0000 mg | ORAL_CAPSULE | ORAL | Status: AC
  Administered 2019-01-01: 06:00:00 300 mg via ORAL
  Filled 2019-01-01: qty 1

## 2019-01-01 MED ORDER — PROPOFOL 10 MG/ML IV BOLUS
INTRAVENOUS | Status: DC | PRN
  Administered 2019-01-01: 200 mg via INTRAVENOUS

## 2019-01-01 MED ORDER — KETOROLAC TROMETHAMINE 30 MG/ML IJ SOLN
INTRAMUSCULAR | Status: AC
  Filled 2019-01-01: qty 1

## 2019-01-01 MED ORDER — SUCCINYLCHOLINE CHLORIDE 200 MG/10ML IV SOSY
PREFILLED_SYRINGE | INTRAVENOUS | Status: DC | PRN
  Administered 2019-01-01: 140 mg via INTRAVENOUS

## 2019-01-01 MED ORDER — FENTANYL CITRATE (PF) 100 MCG/2ML IJ SOLN
INTRAMUSCULAR | Status: DC | PRN
  Administered 2019-01-01: 50 ug via INTRAVENOUS
  Administered 2019-01-01: 100 ug via INTRAVENOUS
  Administered 2019-01-01 (×2): 50 ug via INTRAVENOUS

## 2019-01-01 MED ORDER — ACETAMINOPHEN 500 MG PO TABS
1000.0000 mg | ORAL_TABLET | ORAL | Status: AC
  Administered 2019-01-01: 06:00:00 1000 mg via ORAL
  Filled 2019-01-01: qty 2

## 2019-01-01 MED ORDER — HYDROMORPHONE HCL 1 MG/ML IJ SOLN: 0.2500 mg | INTRAMUSCULAR | Status: DC | PRN

## 2019-01-01 MED ORDER — DEXAMETHASONE SODIUM PHOSPHATE 10 MG/ML IJ SOLN
INTRAMUSCULAR | Status: AC
  Filled 2019-01-01: qty 1

## 2019-01-01 MED ORDER — MIDAZOLAM HCL 2 MG/2ML IJ SOLN
INTRAMUSCULAR | Status: AC
  Filled 2019-01-01: qty 2

## 2019-01-01 MED ORDER — MIDAZOLAM HCL 5 MG/5ML IJ SOLN
INTRAMUSCULAR | Status: DC | PRN
  Administered 2019-01-01: 2 mg via INTRAVENOUS

## 2019-01-01 MED ORDER — CIPROFLOXACIN IN D5W 400 MG/200ML IV SOLN
400.0000 mg | INTRAVENOUS | Status: AC
  Administered 2019-01-01: 400 mg via INTRAVENOUS
  Filled 2019-01-01: qty 200

## 2019-01-01 MED ORDER — ONDANSETRON HCL 4 MG/2ML IJ SOLN
INTRAMUSCULAR | Status: DC | PRN
  Administered 2019-01-01: 4 mg via INTRAVENOUS

## 2019-01-01 MED ORDER — FENTANYL CITRATE (PF) 100 MCG/2ML IJ SOLN: 25.0000 ug | INTRAMUSCULAR | Status: DC | PRN

## 2019-01-01 MED ORDER — LACTATED RINGERS IV SOLN
INTRAVENOUS | Status: DC
  Administered 2019-01-01 (×2): via INTRAVENOUS

## 2019-01-01 MED ORDER — PROPOFOL 10 MG/ML IV BOLUS
INTRAVENOUS | Status: AC
  Filled 2019-01-01: qty 20

## 2019-01-01 MED ORDER — BUPIVACAINE HCL (PF) 0.25 % IJ SOLN
INTRAMUSCULAR | Status: AC
  Filled 2019-01-01: qty 30

## 2019-01-01 MED ORDER — KETAMINE HCL 10 MG/ML IJ SOLN
INTRAMUSCULAR | Status: DC | PRN
  Administered 2019-01-01 (×2): 20 mg via INTRAVENOUS

## 2019-01-01 MED ORDER — KETAMINE HCL 10 MG/ML IJ SOLN
INTRAMUSCULAR | Status: AC
  Filled 2019-01-01: qty 1

## 2019-01-01 MED ORDER — ROCURONIUM BROMIDE 10 MG/ML (PF) SYRINGE
PREFILLED_SYRINGE | INTRAVENOUS | Status: AC
  Filled 2019-01-01: qty 10

## 2019-01-01 MED ORDER — CHLORHEXIDINE GLUCONATE CLOTH 2 % EX PADS: 6.0000 | MEDICATED_PAD | Freq: Once | CUTANEOUS | Status: DC

## 2019-01-01 MED ORDER — FENTANYL CITRATE (PF) 250 MCG/5ML IJ SOLN
INTRAMUSCULAR | Status: AC
  Filled 2019-01-01: qty 5

## 2019-01-01 MED ORDER — LIDOCAINE 2% (20 MG/ML) 5 ML SYRINGE
INTRAMUSCULAR | Status: DC | PRN
  Administered 2019-01-01: 100 mg via INTRAVENOUS

## 2019-01-01 MED ORDER — ROCURONIUM BROMIDE 10 MG/ML (PF) SYRINGE
PREFILLED_SYRINGE | INTRAVENOUS | Status: DC | PRN
  Administered 2019-01-01: 5 mg via INTRAVENOUS
  Administered 2019-01-01: 45 mg via INTRAVENOUS

## 2019-01-01 MED ORDER — LACTATED RINGERS IV SOLN
INTRAVENOUS | Status: DC | PRN
  Administered 2019-01-01: 1000 mL

## 2019-01-01 MED ORDER — KETOROLAC TROMETHAMINE 30 MG/ML IJ SOLN
INTRAMUSCULAR | Status: DC | PRN
  Administered 2019-01-01: 30 mg via INTRAVENOUS

## 2019-01-01 MED ORDER — ONDANSETRON HCL 4 MG/2ML IJ SOLN
INTRAMUSCULAR | Status: AC
  Filled 2019-01-01: qty 2

## 2019-01-01 MED ORDER — SODIUM CHLORIDE (PF) 0.9 % IJ SOLN
INTRAMUSCULAR | Status: AC
  Filled 2019-01-01: qty 50

## 2019-01-01 MED ORDER — SODIUM CHLORIDE 0.9 % IV SOLN
INTRAVENOUS | Status: DC | PRN
  Administered 2019-01-01: 50 mL

## 2019-01-01 MED ORDER — OXYCODONE HCL 5 MG PO TABS: 5.0000 mg | ORAL_TABLET | Freq: Four times a day (QID) | ORAL | 0 refills | Status: DC | PRN

## 2019-01-01 SURGICAL SUPPLY — 62 items
ADH SKN CLS APL DERMABOND .7 (GAUZE/BANDAGES/DRESSINGS)
APL PRP STRL LF DISP 70% ISPRP (MISCELLANEOUS) ×1
APL SKNCLS STERI-STRIP NONHPOA (GAUZE/BANDAGES/DRESSINGS)
APL SRG 38 LTWT LNG FL B (MISCELLANEOUS)
APPLICATOR ARISTA FLEXITIP XL (MISCELLANEOUS) IMPLANT
APPLIER CLIP 5 13 M/L LIGAMAX5 (MISCELLANEOUS)
APPLIER CLIP ROT 10 11.4 M/L (STAPLE) ×3
APR CLP MED LRG 11.4X10 (STAPLE) ×1
APR CLP MED LRG 5 ANG JAW (MISCELLANEOUS)
BAG SPEC RTRVL 10 TROC 200 (ENDOMECHANICALS) ×1
BENZOIN TINCTURE PRP APPL 2/3 (GAUZE/BANDAGES/DRESSINGS) IMPLANT
BNDG ADH 1X3 SHEER STRL LF (GAUZE/BANDAGES/DRESSINGS) ×12 IMPLANT
BNDG ADH THN 3X1 STRL LF (GAUZE/BANDAGES/DRESSINGS) ×4
CABLE HIGH FREQUENCY MONO STRZ (ELECTRODE) ×3 IMPLANT
CHLORAPREP W/TINT 26 (MISCELLANEOUS) ×3 IMPLANT
CLIP APPLIE 5 13 M/L LIGAMAX5 (MISCELLANEOUS) IMPLANT
CLIP APPLIE ROT 10 11.4 M/L (STAPLE) IMPLANT
CLIP VESOLOCK MED LG 6/CT (CLIP) IMPLANT
CLOSURE WOUND 1/2 X4 (GAUZE/BANDAGES/DRESSINGS)
COVER MAYO STAND STRL (DRAPES) IMPLANT
COVER SURGICAL LIGHT HANDLE (MISCELLANEOUS) ×3 IMPLANT
COVER WAND RF STERILE (DRAPES) IMPLANT
DECANTER SPIKE VIAL GLASS SM (MISCELLANEOUS) ×5 IMPLANT
DERMABOND ADVANCED (GAUZE/BANDAGES/DRESSINGS)
DERMABOND ADVANCED .7 DNX12 (GAUZE/BANDAGES/DRESSINGS) IMPLANT
DRAPE C-ARM 42X120 X-RAY (DRAPES) ×2 IMPLANT
DRSG TEGADERM 2-3/8X2-3/4 SM (GAUZE/BANDAGES/DRESSINGS) IMPLANT
ELECT PENCIL ROCKER SW 15FT (MISCELLANEOUS) IMPLANT
ELECT REM PT RETURN 15FT ADLT (MISCELLANEOUS) ×3 IMPLANT
GAUZE SPONGE 2X2 8PLY STRL LF (GAUZE/BANDAGES/DRESSINGS) IMPLANT
GLOVE BIO SURGEON STRL SZ7.5 (GLOVE) ×3 IMPLANT
GLOVE INDICATOR 8.0 STRL GRN (GLOVE) ×3 IMPLANT
GOWN STRL REUS W/TWL XL LVL3 (GOWN DISPOSABLE) ×9 IMPLANT
GRASPER SUT TROCAR 14GX15 (MISCELLANEOUS) ×2 IMPLANT
HEMOSTAT ARISTA ABSORB 3G PWDR (HEMOSTASIS) IMPLANT
HEMOSTAT SNOW SURGICEL 2X4 (HEMOSTASIS) IMPLANT
KIT BASIN OR (CUSTOM PROCEDURE TRAY) ×3 IMPLANT
KIT TURNOVER KIT A (KITS) IMPLANT
L-HOOK LAP DISP 36CM (ELECTROSURGICAL)
LHOOK LAP DISP 36CM (ELECTROSURGICAL) IMPLANT
PENCIL SMOKE EVACUATOR (MISCELLANEOUS) IMPLANT
POUCH RETRIEVAL ECOSAC 10 (ENDOMECHANICALS) ×1 IMPLANT
POUCH RETRIEVAL ECOSAC 10MM (ENDOMECHANICALS) ×2
SCISSORS LAP 5X35 DISP (ENDOMECHANICALS) ×3 IMPLANT
SET CHOLANGIOGRAPH MIX (MISCELLANEOUS) ×2 IMPLANT
SET IRRIG TUBING LAPAROSCOPIC (IRRIGATION / IRRIGATOR) ×3 IMPLANT
SET TUBE SMOKE EVAC HIGH FLOW (TUBING) ×3 IMPLANT
SLEEVE XCEL OPT CAN 5 100 (ENDOMECHANICALS) ×6 IMPLANT
SPONGE GAUZE 2X2 STER 10/PKG (GAUZE/BANDAGES/DRESSINGS)
STRIP CLOSURE SKIN 1/2X4 (GAUZE/BANDAGES/DRESSINGS) IMPLANT
SUT MNCRL AB 4-0 PS2 18 (SUTURE) ×3 IMPLANT
SUT VIC AB 0 UR5 27 (SUTURE) IMPLANT
SUT VIC AB 3-0 SH 27 (SUTURE) ×3
SUT VIC AB 3-0 SH 27XBRD (SUTURE) IMPLANT
SUT VICRYL 0 TIES 12 18 (SUTURE) IMPLANT
SUT VICRYL 0 UR6 27IN ABS (SUTURE) ×2 IMPLANT
TOWEL OR 17X26 10 PK STRL BLUE (TOWEL DISPOSABLE) ×3 IMPLANT
TOWEL OR NON WOVEN STRL DISP B (DISPOSABLE) ×3 IMPLANT
TRAY LAPAROSCOPIC (CUSTOM PROCEDURE TRAY) ×3 IMPLANT
TROCAR BLADELESS OPT 5 100 (ENDOMECHANICALS) ×3 IMPLANT
TROCAR XCEL BLUNT TIP 100MML (ENDOMECHANICALS) ×2 IMPLANT
TROCAR XCEL NON-BLD 11X100MML (ENDOMECHANICALS) IMPLANT

## 2019-01-01 NOTE — Discharge Instructions (Signed)
[facility] °LAPAROSCOPIC SURGERY: POST OP INSTRUCTIONS °Always review your discharge instruction sheet given to you by the facility where your surgery was performed. °IF YOU HAVE DISABILITY OR FAMILY LEAVE FORMS, YOU MUST BRING THEM TO THE OFFICE FOR PROCESSING.   °DO NOT GIVE THEM TO YOUR DOCTOR. ° °PAIN CONTROL ° °1. First take acetaminophen (Tylenol) AND/or ibuprofen (Advil) to control your pain after surgery.  Follow directions on package.  Taking acetaminophen (Tylenol) and/or ibuprofen (Advil) regularly after surgery will help to control your pain and lower the amount of prescription pain medication you may need.  You should not take more than 3,000 mg (3 grams) of acetaminophen (Tylenol) in 24 hours.  You should not take ibuprofen (Advil), aleve, motrin, naprosyn or other NSAIDS if you have a history of stomach ulcers or chronic kidney disease.  °2. A prescription for pain medication may be given to you upon discharge.  Take your pain medication as prescribed, if you still have uncontrolled pain after taking acetaminophen (Tylenol) or ibuprofen (Advil). °3. Use ice packs to help control pain. °4. If you need a refill on your pain medication, please contact your pharmacy.  They will contact our office to request authorization. Prescriptions will not be filled after 5pm or on week-ends. ° °HOME MEDICATIONS °5. Take your usually prescribed medications unless otherwise directed. ° °DIET °6. You should follow a light diet the first few days after arrival home.  Be sure to include lots of fluids daily. Avoid fatty, fried foods.  ° °CONSTIPATION °7. It is common to experience some constipation after surgery and if you are taking pain medication.  Increasing fluid intake and taking a stool softener (such as Colace) will usually help or prevent this problem from occurring.  A mild laxative (Milk of Magnesia or Miralax) should be taken according to package instructions if there are no bowel  movements after 48 hours. ° °WOUND/INCISION CARE °8. Most patients will experience some swelling and bruising in the area of the incisions.  Ice packs will help.  Swelling and bruising can take several days to resolve.  °9. Unless discharge instructions indicate otherwise, follow guidelines below  °a. STERI-STRIPS - you may remove your outer bandages 48 hours after surgery, and you may shower at that time.  You have steri-strips (small skin tapes) in place directly over the incision.  These strips should be left on the skin for 7-10 days.   °b. DERMABOND/SKIN GLUE - you may shower in 24 hours.  The glue will flake off over the next 2-3 weeks. °10. Any sutures or staples will be removed at the office during your follow-up visit. ° °ACTIVITIES °11. You may resume regular (light) daily activities beginning the next day--such as daily self-care, walking, climbing stairs--gradually increasing activities as tolerated.  You may have sexual intercourse when it is comfortable.  Refrain from any heavy lifting or straining until approved by your doctor. °a. You may drive when you are no longer taking prescription pain medication, you can comfortably wear a seatbelt, and you can safely maneuver your car and apply brakes. ° °FOLLOW-UP °12. You should see your doctor in the office for a follow-up appointment approximately 2-3 weeks after your surgery.  You should have been given your post-op/follow-up appointment when your surgery was scheduled.  If you did not receive a post-op/follow-up appointment, make sure that you call for this appointment within a day or two after you arrive home to insure a convenient appointment time. ° °OTHER   INSTRUCTIONS °13.  ° °WHEN TO CALL YOUR DOCTOR: °1. Fever over 101.0 °2. Inability to urinate °3. Continued bleeding from incision. °4. Increased pain, redness, or drainage from the incision. °5. Increasing abdominal pain ° °The clinic staff is available to answer your questions during regular  business hours.  Please don’t hesitate to call and ask to speak to one of the nurses for clinical concerns.  If you have a medical emergency, go to the nearest emergency room or call 911.  A surgeon from [facility] is always on call at the hospital. °[address], [address], [place], [place]  [address] ? [address] [address], [place], [place]   [address] °[phone] ? [phone] ? FAX [phone] °Web site: [email or web address] ° °••••••••• ° ° °Managing Your Pain After Surgery Without Opioids ° ° ° °Thank you for participating in our program to help patients manage their pain after surgery without opioids. This is part of our effort to provide you with the best care possible, without exposing you or your family to the risk that opioids pose. ° °What pain can I expect after surgery? °You can expect to have some pain after surgery. This is normal. The pain is typically worse the day after surgery, and quickly begins to get better. °Many studies have found that many patients are able to manage their pain after surgery with Over-the-Counter (OTC) medications such as Tylenol and Motrin. If you have a condition that does not allow you to take Tylenol or Motrin, notify your surgical team. ° °How will I manage my pain? °The best strategy for controlling your pain after surgery is around the clock pain control with Tylenol (acetaminophen) and Motrin (ibuprofen or Advil). Alternating these medications with each other allows you to maximize your pain control. In addition to Tylenol and Motrin, you can use heating pads or ice packs on your incisions to help reduce your pain. ° °How will I alternate your regular strength over-the-counter pain medication? °You will take a dose of pain medication every three hours. °; Start by taking 650 mg of Tylenol (2 pills of 325 mg) °; 3 hours later take 600 mg of Motrin (3 pills of 200 mg) °; 3 hours after taking the Motrin take 650 mg of Tylenol °; 3 hours  after that take 600 mg of Motrin. ° ° °- 1 - ° °See example - if your first dose of Tylenol is at 12:00 PM ° ° °12:00 PM Tylenol 650 mg (2 pills of 325 mg)  °3:00 PM Motrin 600 mg (3 pills of 200 mg)  °6:00 PM Tylenol 650 mg (2 pills of 325 mg)  °9:00 PM Motrin 600 mg (3 pills of 200 mg)  °Continue alternating every 3 hours  ° °We recommend that you follow this schedule around-the-clock for at least 3 days after surgery, or until you feel that it is no longer needed. Use the table on the last page of this handout to keep track of the medications you are taking. °Important: °Do not take more than 3000mg of Tylenol or 2300mg of Motrin in a 24-hour period. °Do not take ibuprofen/Motrin if you have a history of bleeding stomach ulcers, severe kidney disease, &/or actively taking a blood thinner ° °What if I still have pain? °If you have pain that is not controlled with the over-the-counter pain medications (Tylenol and Motrin or Advil) you might have what we call “breakthrough” pain. You will receive a prescription for a small amount of an opioid   pain medication such as Oxycodone, Tramadol, or Tylenol with Codeine. Use these opioid pills in the first 24 hours after surgery if you have breakthrough pain. Do not take more than 1 pill every 4-6 hours. ° °If you still have uncontrolled pain after using all opioid pills, don't hesitate to call our staff using the number provided. We will help make sure you are managing your pain in the best way possible, and if necessary, we can provide a prescription for additional pain medication. ° ° °Day 1   ° °Time  °Name of Medication Number of pills taken  °Amount of Acetaminophen  °Pain Level  ° °Comments  °AM PM       °AM PM       °AM PM       °AM PM       °AM PM       °AM PM       °AM PM       °AM PM       °Total Daily amount of Acetaminophen °Do not take more than  3,000 mg per day    ° ° °Day 2   ° °Time  °Name of Medication Number of pills °taken  °Amount of Acetaminophen  °Pain  Level  ° °Comments  °AM PM       °AM PM       °AM PM       °AM PM       °AM PM       °AM PM       °AM PM       °AM PM       °Total Daily amount of Acetaminophen °Do not take more than  3,000 mg per day    ° ° °Day 3   ° °Time  °Name of Medication Number of pills taken  °Amount of Acetaminophen  °Pain Level  ° °Comments  °AM PM       °AM PM       °AM PM       °AM PM       ° ° ° °AM PM       °AM PM       °AM PM       °AM PM       °Total Daily amount of Acetaminophen °Do not take more than  3,000 mg per day    ° ° °Day 4   ° °Time  °Name of Medication Number of pills taken  °Amount of Acetaminophen  °Pain Level  ° °Comments  °AM PM       °AM PM       °AM PM       °AM PM       °AM PM       °AM PM       °AM PM       °AM PM       °Total Daily amount of Acetaminophen °Do not take more than  3,000 mg per day    ° ° °Day 5   ° °Time  °Name of Medication Number °of pills taken  °Amount of Acetaminophen  °Pain Level  ° °Comments  °AM PM       °AM PM       °AM PM       °AM PM       °AM PM       °AM PM       °  AM PM       °AM PM       °Total Daily amount of Acetaminophen °Do not take more than  3,000 mg per day    ° ° ° °Day 6   ° °Time  °Name of Medication Number of pills °taken  °Amount of Acetaminophen  °Pain Level  °Comments  °AM PM       °AM PM       °AM PM       °AM PM       °AM PM       °AM PM       °AM PM       °AM PM       °Total Daily amount of Acetaminophen °Do not take more than  3,000 mg per day    ° ° °Day 7   ° °Time  °Name of Medication Number of pills taken  °Amount of Acetaminophen  °Pain Level  ° °Comments  °AM PM       °AM PM       °AM PM       °AM PM       °AM PM       °AM PM       °AM PM       °AM PM       °Total Daily amount of Acetaminophen °Do not take more than  3,000 mg per day    ° ° ° ° °For additional information about how and where to safely dispose of unused opioid °medications - https://www.morepowerful[employer].org ° °Disclaimer: This document contains information and/or instructional materials adapted  from [facility] for the typical patient with your condition. It does not replace medical advice from your health care provider because your experience may differ from that of the °typical patient. Talk to your health care provider if you have any questions about this °document, your condition or your treatment plan. °Adapted from [facility] ° ° °

## 2019-01-01 NOTE — Transfer of Care (Signed)
Immediate Anesthesia Transfer of Care Note  Patient: Barry Mercado  Procedure(s) Performed: LAPAROSCOPIC CHOLECYSTECTOMY WITH INTRAOPERATIVE CHOLANGIOGRAM (N/A )  Patient Location: PACU  Anesthesia Type:General  Level of Consciousness: awake, alert  and oriented  Airway & Oxygen Therapy: Patient Spontanous Breathing and Patient connected to face mask oxygen  Post-op Assessment: Report given to RN and Post -op Vital signs reviewed and stable  Post vital signs: Reviewed and stable  Last Vitals:  Vitals Value Taken Time  BP 150/89 01/01/19 0918  Temp    Pulse 86 01/01/19 0919  Resp    SpO2 98 % 01/01/19 0919  Vitals shown include unvalidated device data.  Last Pain:  Vitals:   01/01/19 0603  TempSrc:   PainSc: 0-No pain      Patients Stated Pain Goal: 5 ([id number] [phone])  Complications: No apparent anesthesia complications

## 2019-01-01 NOTE — H&P (Signed)
Barry Mercado is an 53 y.o. male.   Chief Complaint: here for surgery HPI: 53 yo presents for gallbladder surgery. Denies medical changes since seen in clinic.   The patient is a 53 year old male who presents for evaluation of gall stones. he is referred by Dr Barry Mercado for evaluation of epigastric pain and left upper quadrant pain. He reports that he awoke the other day with pressure in his epigastrium. It was quite intense and it did not get better so after about 7 hours he went to the emergency room. He was in so much discomfort he was sweating causing nausea and vomiting. He only got relief with the IV pain medicine. He did take some Pepcid at home that morning without any improvement. In the emergency room he had a CT scan and ultrasound. He was found to have a fat-containing umbilical hernia and gallstones. Ultrasound showed fatty liver and gallstones. Labs were unremarkable except for an ALT of 50. He reports that over the years he had several episodes like that where he had epigastric and left upper quadrant pain lasting generally less than 6 hours. He describes the discomfort as a twisting sensation. He denies any heartburn, indigestion, burning sensation in his chest or early satiety. No frequent NSAID use. No prior abdominal surgeries. No prior blood clots. Normal stool pattern.  Past Medical History:  Diagnosis Date  . Cancer [facility])    testicular ca    Past Surgical History:  Procedure Laterality Date  . testicular cancer surgery      2008    Family History  Problem Relation Age of Onset  . COPD Father    Social History:  reports that he has never smoked. He has never used smokeless tobacco. He reports that he does not drink alcohol or use drugs.  Allergies:  Allergies  Allergen Reactions  . Penicillins Other (See Comments)    Did it involve swelling of the face/tongue/throat, SOB, or low BP? Unknown Did it involve sudden or severe rash/hives, skin peeling,  or any reaction on the inside of your mouth or nose? Unknown Did you need to seek medical attention at a hospital or doctor's office? Unknown When did it last happen?Childhood If all above answers are "NO", may proceed with cephalosporin use.     Medications Prior to Admission  Medication Sig Dispense Refill  . aspirin-acetaminophen-caffeine (EXCEDRIN MIGRAINE) 250-250-65 MG tablet Take 2 tablets by mouth every 6 (six) hours as needed for headache.    . fluticasone (FLONASE) 50 MCG/ACT nasal spray Place 1 spray into both nostrils daily as needed for allergies or rhinitis.    Marland Kitchen ibuprofen (ADVIL,MOTRIN) 200 MG tablet Take 400 mg by mouth every 6 (six) hours as needed for moderate pain.     . Menthol, Topical Analgesic, (ZIMS MAX-FREEZE EX) Apply 1 application topically daily as needed (pain).    . naproxen sodium (ALEVE) 220 MG tablet Take 220 mg by mouth daily as needed (pain).    Marland Kitchen loratadine (CLARITIN) 10 MG tablet Take 10 mg by mouth daily as needed for allergies.    Marland Kitchen ondansetron (ZOFRAN ODT) 4 MG disintegrating tablet 4mg  ODT q4 hours prn nausea/vomit (Patient not taking: Reported on 12/16/2018) 12 tablet 0  . oxyCODONE-acetaminophen (PERCOCET) 5-325 MG tablet Take 1-2 tablets by mouth every 4 (four) hours as needed for severe pain. (Patient not taking: Reported on 12/16/2018) 10 tablet 0    No results found for this or any previous visit (from the past 48  hour(s)). No results found.  Review of Systems  Constitutional: Negative for weight loss.  HENT: Negative for nosebleeds.   Eyes: Negative for blurred vision.  Respiratory: Negative for shortness of breath.   Cardiovascular: Negative for chest pain, palpitations, orthopnea and PND.       Denies DOE  Genitourinary: Negative for dysuria and hematuria.  Musculoskeletal: Negative.   Skin: Negative for itching and rash.  Neurological: Negative for dizziness, focal weakness, seizures, loss of consciousness and headaches.        Denies TIAs, amaurosis fugax  Endo/Heme/Allergies: Does not bruise/bleed easily.  Psychiatric/Behavioral: The patient is not nervous/anxious.     Blood pressure 137/87, pulse 89, temperature 98.2 F (36.8 C), temperature source Oral, resp. rate 18, height 5\' 11"  (1.803 m), weight 109.4 kg, SpO2 96 %. Physical Exam  Vitals reviewed. Constitutional: He is oriented to person, place, and time. He appears well-developed and well-nourished. No distress.  HENT:  Head: Normocephalic and atraumatic.  Right Ear: External ear normal.  Left Ear: External ear normal.  Eyes: Conjunctivae are normal. No scleral icterus.  Neck: Normal range of motion. Neck supple. No tracheal deviation present. No thyromegaly present.  Cardiovascular: Normal rate and normal heart sounds.  Respiratory: Effort normal and breath sounds normal. No stridor. No respiratory distress. He has no wheezes.  GI: Soft. He exhibits no distension. There is no abdominal tenderness. There is no rebound.  Musculoskeletal:        General: No tenderness or edema.  Lymphadenopathy:    He has no cervical adenopathy.  Neurological: He is alert and oriented to person, place, and time. He exhibits normal muscle tone.  Skin: Skin is warm and dry. No rash noted. He is not diaphoretic. No erythema. No pallor.  Psychiatric: He has a normal mood and affect. His behavior is normal. Judgment and thought content normal.     Assessment/Plan Symptomatic cholelithiasis Fatty liver Small umbilical hernia  To OR for Lap chole All questions asked and answered ERAS protocol  Barry Pickerel, MD 01/01/2019, 7:23 AM

## 2019-01-01 NOTE — Anesthesia Postprocedure Evaluation (Signed)
Anesthesia Post Note  Patient: Barry Mercado  Procedure(s) Performed: LAPAROSCOPIC CHOLECYSTECTOMY WITH INTRAOPERATIVE CHOLANGIOGRAM (N/A )     Patient location during evaluation: PACU Anesthesia Type: General Level of consciousness: awake Pain management: pain level controlled Vital Signs Assessment: post-procedure vital signs reviewed and stable Cardiovascular status: stable Postop Assessment: no apparent nausea or vomiting Anesthetic complications: no    Last Vitals:  Vitals:   01/01/19 0945 01/01/19 1046  BP: (!) 140/95 (!) 142/91  Pulse: 86   Resp: 14 18  Temp: 36.8 C (!) 36.3 C  SpO2: 95% 95%    Last Pain:  Vitals:   01/01/19 1046  TempSrc: Axillary  PainSc: 0-No pain                 Shown Dissinger

## 2019-01-01 NOTE — Anesthesia Procedure Notes (Signed)
Procedure Name: Intubation Date/Time: 01/01/2019 7:41 AM Performed by: Cherell Colvin D, CRNA Pre-anesthesia Checklist: Patient identified, Emergency Drugs available, Suction available and Patient being monitored Patient Re-evaluated:Patient Re-evaluated prior to induction Oxygen Delivery Method: Circle system utilized Preoxygenation: Pre-oxygenation with 100% oxygen Induction Type: IV induction Ventilation: Mask ventilation without difficulty Laryngoscope Size: Mac and 4 Grade View: Grade I Tube type: Oral Tube size: 7.5 mm Number of attempts: 1 Airway Equipment and Method: Stylet and Oral airway Placement Confirmation: ETT inserted through vocal cords under direct vision,  positive ETCO2 and breath sounds checked- equal and bilateral Secured at: 22 cm Tube secured with: Tape Dental Injury: Teeth and Oropharynx as per pre-operative assessment

## 2019-01-01 NOTE — Op Note (Signed)
Barry Mercado [id number] 01/14/1966 01/01/2019  Laparoscopic Cholecystectomy with IOC Procedure Note  Indications: This patient presents with symptomatic gallbladder disease and will undergo laparoscopic cholecystectomy.  Pre-operative Diagnosis: symptomatic cholelithiasis, umbilical hernia  Post-operative Diagnosis: chronic calculous cholecystitis; fatty liver; umbilical hernia  Surgeon: Greer Pickerel MD FACS  Assistants: none  Anesthesia: General endotracheal anesthesia  Procedure Details  The patient was seen again in the Holding Room. The risks, benefits, complications, treatment options, and expected outcomes were discussed with the patient. The possibilities of reaction to medication, pulmonary aspiration, perforation of viscus, bleeding, recurrent infection, finding a normal gallbladder, the need for additional procedures, failure to diagnose a condition, the possible need to convert to an open procedure, and creating a complication requiring transfusion or operation were discussed with the patient. The likelihood of improving the patient's symptoms with return to their baseline status is good.  The patient and/or family concurred with the proposed plan, giving informed consent. The site of surgery properly noted. The patient was taken to Operating Room, identified as Barry Mercado and the procedure verified as Laparoscopic Cholecystectomy with Intraoperative Cholangiogram. A Time Out was held and the above information confirmed. Antibiotic prophylaxis was administered.   Prior to the induction of general anesthesia, antibiotic prophylaxis was administered. General endotracheal anesthesia was then administered and tolerated well. After the induction, the abdomen was prepped with Chloraprep and draped in the sterile fashion. The patient was positioned in the supine position.  Local anesthetic agent was injected into the skin near the umbilicus and an incision made.  The patient had a  pre-existing small fascial defect at the umbilicus. we dissected down to the abdominal fascia with blunt dissection.  The fascia was incised further vertically and we entered the peritoneal cavity bluntly.  A pursestring suture of 0-Vicryl was placed around the fascial opening.  The Hasson cannula was inserted and secured with the stay suture.  Pneumoperitoneum was then created with CO2 and tolerated well without any adverse changes in the patient's vital signs. An 5-mm port was placed in the subxiphoid position.  Two 5-mm ports were placed in the right upper quadrant. All skin incisions were infiltrated with a local anesthetic agent before making the incision and placing the trocars.   We positioned the patient in reverse Trendelenburg, tilted slightly to the patient's left.  The patient had a fatty liver.  The gallbladder was identified, the fundus grasped and retracted cephalad.  There was some spillage of bile from the gallbladder as the fundus was grasped.  Adhesions were lysed bluntly and with the electrocautery where indicated, taking care not to injure any adjacent organs or viscus. The infundibulum was grasped and retracted laterally, exposing the peritoneum overlying the triangle of Calot. This was then divided and exposed in a blunt fashion. A critical view of the cystic duct and cystic artery was obtained.  The cystic duct was clearly identified and bluntly dissected circumferentially. The cystic duct was ligated with a clip distally.   An incision was made in the cystic duct and the [facility] cholangiogram catheter introduced. The catheter was secured using a clip. A cholangiogram was then obtained which showed good visualization of the distal and proximal biliary tree with no sign of filling defects or obstruction.  Contrast flowed easily into the duodenum. The catheter was then removed.   The cystic duct was then ligated with clips and divided. The cystic artery which had been identified & dissected  free was ligated with clips and divided as well.  The gallbladder was dissected from the liver bed in retrograde fashion with the electrocautery. The gallbladder was removed and placed in an Ecco sac.  The gallbladder and Ecco sac were then removed through the umbilical port site. The liver bed was irrigated and inspected. Hemostasis was achieved with the electrocautery. Copious irrigation was utilized and was repeatedly aspirated until clear.  The pursestring suture was used to close the umbilical fascia.  An additional 2 interrupted 0 Vicryl sutures were placed at the umbilical fascia using the PMI suture passer laparoscopic guidance  We again inspected the right upper quadrant for hemostasis.  The umbilical closure was inspected and there was no air leak and nothing trapped within the closure. Pneumoperitoneum was released as we removed the trocars.  4-0 Monocryl was used to close the skin.   Benzoin, steri-strips, and clean dressings were applied. The patient was then extubated and brought to the recovery room in stable condition. Instrument, sponge, and needle counts were correct at closure and at the conclusion of the case.   Findings: Chronic cholecystitis with Cholelithiasis Fatty liver Small fascial defect of the umbilicus  Estimated Blood Loss: Minimal         Drains: none         Specimens: Gallbladder           Complications: None; patient tolerated the procedure well.         Disposition: PACU - hemodynamically stable.         Condition: stable  Leighton Ruff. Redmond Pulling, MD, FACS General, Bariatric, & Minimally Invasive Surgery [facility], [place]

## 2019-01-02 ENCOUNTER — Encounter (HOSPITAL_COMMUNITY): Payer: Self-pay | Admitting: General Surgery

## 2019-01-04 LAB — SURGICAL PATHOLOGY

## 2019-06-23 ENCOUNTER — Inpatient Hospital Stay: Payer: 59 | Attending: Oncology

## 2019-06-23 ENCOUNTER — Other Ambulatory Visit: Payer: Self-pay

## 2019-06-23 ENCOUNTER — Inpatient Hospital Stay: Payer: 59 | Admitting: Oncology

## 2019-06-23 VITALS — BP 124/89 | HR 76 | Temp 98.3°F | Resp 18 | Ht 71.0 in | Wt 245.4 lb

## 2019-06-23 DIAGNOSIS — C6292 Malignant neoplasm of left testis, unspecified whether descended or undescended: Secondary | ICD-10-CM

## 2019-06-23 DIAGNOSIS — Z8547 Personal history of malignant neoplasm of testis: Secondary | ICD-10-CM | POA: Insufficient documentation

## 2019-06-23 LAB — CMP (CANCER CENTER ONLY)
ALT: 79 U/L — ABNORMAL HIGH (ref 0–44)
AST: 40 U/L (ref 15–41)
Albumin: 4.3 g/dL (ref 3.5–5.0)
Alkaline Phosphatase: 88 U/L (ref 38–126)
Anion gap: 7 (ref 5–15)
BUN: 13 mg/dL (ref 6–20)
CO2: 27 mmol/L (ref 22–32)
Calcium: 9.1 mg/dL (ref 8.9–10.3)
Chloride: 105 mmol/L (ref 98–111)
Creatinine: 1.16 mg/dL (ref 0.61–1.24)
GFR, Est AFR Am: >60 mL/min (ref >60)
GFR, Estimated: >60 mL/min (ref >60)
Glucose, Bld: 204 mg/dL — ABNORMAL HIGH (ref 70–99)
Potassium: 4.5 mmol/L (ref 3.5–5.1)
Sodium: 139 mmol/L (ref 135–145)
Total Bilirubin: 1.2 mg/dL (ref 0.3–1.2)
Total Protein: 7.3 g/dL (ref 6.5–8.1)

## 2019-06-23 LAB — CBC WITH DIFFERENTIAL (CANCER CENTER ONLY)
Abs Immature Granulocytes: 0.01 10*3/uL (ref 0.00–0.07)
Basophils Absolute: 0 10*3/uL (ref 0.0–0.1)
Basophils Relative: 1 %
Eosinophils Absolute: 0.2 10*3/uL (ref 0.0–0.5)
Eosinophils Relative: 4 %
HCT: 49.7 % (ref 39.0–52.0)
Hemoglobin: 17.5 g/dL — ABNORMAL HIGH (ref 13.0–17.0)
Immature Granulocytes: 0 %
Lymphocytes Relative: 29 %
Lymphs Abs: 1.1 10*3/uL (ref 0.7–4.0)
MCH: 31.9 pg (ref 26.0–34.0)
MCHC: 35.2 g/dL (ref 30.0–36.0)
MCV: 90.5 fL (ref 80.0–100.0)
Monocytes Absolute: 0.5 10*3/uL (ref 0.1–1.0)
Monocytes Relative: 12 %
Neutro Abs: 2.1 10*3/uL (ref 1.7–7.7)
Neutrophils Relative %: 54 %
Platelet Count: 159 10*3/uL (ref 150–400)
RBC: 5.49 MIL/uL (ref 4.22–5.81)
RDW: 12.6 % (ref 11.5–15.5)
WBC Count: 3.9 10*3/uL — ABNORMAL LOW (ref 4.0–10.5)
nRBC: 0 % (ref 0.0–0.2)

## 2019-06-23 LAB — LACTATE DEHYDROGENASE: LDH: 204 U/L — ABNORMAL HIGH (ref 98–192)

## 2019-06-23 NOTE — Progress Notes (Signed)
Hematology and Oncology Follow Up Visit  Barry Mercado [id number] Feb 05, 1966 54 y.o. 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   Prior Therapy: 1. Underwent radical orchiectomy in 02/2006 .  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   2. He received 3 cycles of BEP chemotherapy with normalization of his tumor marker.  Therapy ended in April 2008.  Current therapy: Active surveillance.  Interim History:  Barry Mercado returns today for a follow-up visit.  Since her last visit, he reports no major changes in his health.  He has not underwent a gallbladder surgery in October 2020 due to recurrent cholelithiasis and pain.  At that time, he reports no recent complaints.  He denies any abdominal pain or nausea.  He does report loose bowel habits after certain foods.  Performance status quality of life remains about the same.  Continues to work full-time.  He is less active however and has gained more weight.         Medications: Updated on review.  Current Outpatient Medications:  .  aspirin-acetaminophen-caffeine (EXCEDRIN MIGRAINE) 250-250-65 MG tablet, Take 2 tablets by mouth every 6 (six) hours as needed for headache., Disp: , Rfl:  .  fluticasone (FLONASE) 50 MCG/ACT nasal spray, Place 1 spray into both nostrils daily as needed for allergies or rhinitis., Disp: , Rfl:  .  ibuprofen (ADVIL,MOTRIN) 200 MG tablet, Take 400 mg by mouth every 6 (six) hours as needed for moderate pain. , Disp: , Rfl:  .  loratadine (CLARITIN) 10 MG tablet, Take 10 mg by mouth daily as needed for allergies., Disp: , Rfl:  .  Menthol, Topical Analgesic, (ZIMS MAX-FREEZE EX), Apply 1 application topically daily as needed (pain)., Disp: , Rfl:  .  oxyCODONE (OXY IR/ROXICODONE) 5 MG immediate release tablet, Take 1 tablet (5 mg total) by mouth every 6 (six) hours as needed  for severe pain., Disp: 12 tablet, Rfl: 0  Allergies:  Allergies  Allergen Reactions  . Penicillins Other (See Comments)    Did it involve swelling of the face/tongue/throat, SOB, or low BP? Unknown Did it involve sudden or severe rash/hives, skin peeling, or any reaction on the inside of your mouth or nose? Unknown Did you need to seek medical attention at a hospital or doctor's office? Unknown When did it last happen?Childhood If all above answers are "NO", may proceed with cephalosporin use.       Physical Exam: Blood pressure 124/89, pulse 76, temperature 98.3 F (36.8 C), temperature source Temporal, resp. rate 18, height 5\' 11"  (1.803 m), weight 245 lb 6.4 oz (111.3 kg), SpO2 97 %.     ECOG: 0    General appearance: Alert, awake without any distress. Head: Atraumatic without abnormalities Oropharynx: Without any thrush or ulcers. Eyes: No scleral icterus. Lymph nodes: No lymphadenopathy noted in the cervical, supraclavicular, or axillary nodes Heart:regular rate and rhythm, without any murmurs or gallops.   Lung: Clear to auscultation without any rhonchi, wheezes or dullness to percussion. Abdomin: Soft, nontender without any shifting dullness or ascites. Musculoskeletal: No clubbing or cyanosis. Neurological: No motor or sensory deficits. Skin: No rashes or lesions.     Lab Results: Lab Results  Component Value Date   WBC 3.4 (L) 12/21/2018   HGB 16.3 12/21/2018   HCT 46.7 12/21/2018   MCV 89.5 12/21/2018   PLT 163 12/21/2018     Chemistry  Component Value Date/Time   NA 141 10/28/2018 1251   NA 142 05/24/2016 0816   K 4.2 10/28/2018 1251   K 4.6 05/24/2016 0816   CL 105 10/28/2018 1251   CL 107 05/21/2012 0823   CO2 25 10/28/2018 1251   CO2 28 05/24/2016 0816   BUN 22 (H) 10/28/2018 1251   BUN 16.2 05/24/2016 0816   CREATININE 1.05 10/28/2018 1251   CREATININE 1.05 07/23/2018 0848   CREATININE 1.2 05/24/2016 0816      Component  Value Date/Time   CALCIUM 9.6 10/28/2018 1251   CALCIUM 9.3 05/24/2016 0816   ALKPHOS 65 10/28/2018 1251   ALKPHOS 64 05/24/2016 0816   AST 29 10/28/2018 1251   AST 22 07/23/2018 0848   AST 21 05/24/2016 0816   ALT 50 (H) 10/28/2018 1251   ALT 38 07/23/2018 0848   ALT 34 05/24/2016 0816   BILITOT 1.1 10/28/2018 1251   BILITOT 0.6 07/23/2018 0848   BILITOT 0.59 05/24/2016 0816     IMPRESSION: Cholelithiasis with 2 tiny calcifications at either the gallbladder neck or cystic duct, without CT evidence of acute cholecystitis; recommend correlation with LFTs.  Small umbilical hernia containing fat.  No other intra-abdominal or intrapelvic abnormalities identified.   Impression and Plan:  54 year old man with:  1.  Stage Ib testicular cancer left testicular cancerdiagnosed in 2007.  He presented with nonseminomatous tumor with a 60% seminoma, 20% embryonal and 10% teratoma as well as 10% yolk sac.  He status post therapy outlined above without any evidence to suggest relapsed disease.  CT scan obtained in August 2020 for evaluation of cholelithiasis did not show any malignancy.  Risks and benefits of continued active surveillance at this time.  Risk of relapse is decreased significantly but the element of seminoma could recur years down the line.  2.  Survivorship: We continue to emphasize the importance of follow-up routine health maintenance in the setting of previous chemotherapy exposure.  No evidence of cardiovascular disease or myelodysplasia.  We also continue to emphasize healthy lifestyle including weight loss and exercise.  He is up-to-date on his Covid vaccination with the second injection scheduled for the next few weeks.  3.  Age-appropriate cancer screening: Continues to be up-to-date including colonoscopy.  4.  Follow-up: In 12 months for repeat evaluation.  30  minutes were dedicated to this visit. The time was spent on reviewing laboratory data, imaging studies,  reviewing risk of relapse and future plan of care discussion.   Zola Button, MD 4/14/20219:03 AM

## 2019-06-24 ENCOUNTER — Telehealth: Payer: Self-pay | Admitting: Oncology

## 2019-06-24 LAB — AFP TUMOR MARKER: AFP, Serum, Tumor Marker: 4.2 ng/mL (ref 0.0–8.3)

## 2019-06-24 LAB — BETA HCG QUANT (REF LAB): hCG Quant: <1 m[IU]/mL (ref 0–3)

## 2019-06-24 NOTE — Telephone Encounter (Signed)
Scheduled appt per 4/14 los.  Left a vm of the appt date and time.

## 2019-08-23 ENCOUNTER — Other Ambulatory Visit: Payer: Self-pay | Admitting: Sports Medicine

## 2019-08-23 DIAGNOSIS — M47812 Spondylosis without myelopathy or radiculopathy, cervical region: Secondary | ICD-10-CM

## 2019-09-02 ENCOUNTER — Other Ambulatory Visit: Payer: Self-pay

## 2019-09-02 ENCOUNTER — Ambulatory Visit
Admission: RE | Admit: 2019-09-02 | Discharge: 2019-09-02 | Disposition: A | Discharge status: Home / Self Care | Payer: No Typology Code available for payment source | Source: Ambulatory Visit | Attending: Sports Medicine | Admitting: Sports Medicine

## 2019-09-02 DIAGNOSIS — M47812 Spondylosis without myelopathy or radiculopathy, cervical region: Secondary | ICD-10-CM

## 2019-09-02 MED ORDER — METHYLPREDNISOLONE ACETATE 40 MG/ML INJ SUSP (RADIOLOG
120.0000 mg | Freq: Once | INTRAMUSCULAR | Status: AC
  Administered 2019-09-02: 120 mg via EPIDURAL

## 2019-09-02 MED ORDER — IOPAMIDOL (ISOVUE-M 300) INJECTION 61%
1.0000 mL | Freq: Once | INTRAMUSCULAR | Status: AC
  Administered 2019-09-02: 1 mL via EPIDURAL

## 2019-09-02 NOTE — Discharge Instructions (Signed)

## 2020-06-23 ENCOUNTER — Other Ambulatory Visit: Payer: Self-pay

## 2020-06-23 ENCOUNTER — Inpatient Hospital Stay: Payer: No Typology Code available for payment source

## 2020-06-23 ENCOUNTER — Inpatient Hospital Stay: Payer: No Typology Code available for payment source | Attending: Oncology | Admitting: Oncology

## 2020-06-23 VITALS — BP 123/82 | HR 74 | Temp 96.7°F | Resp 18 | Ht 71.0 in | Wt 230.9 lb

## 2020-06-23 DIAGNOSIS — Z9221 Personal history of antineoplastic chemotherapy: Secondary | ICD-10-CM | POA: Diagnosis not present

## 2020-06-23 DIAGNOSIS — Z8547 Personal history of malignant neoplasm of testis: Secondary | ICD-10-CM | POA: Diagnosis present

## 2020-06-23 DIAGNOSIS — C6292 Malignant neoplasm of left testis, unspecified whether descended or undescended: Secondary | ICD-10-CM

## 2020-06-23 LAB — CMP (CANCER CENTER ONLY)
ALT: 46 U/L — ABNORMAL HIGH (ref 0–44)
AST: 22 U/L (ref 15–41)
Albumin: 4.1 g/dL (ref 3.5–5.0)
Alkaline Phosphatase: 69 U/L (ref 38–126)
Anion gap: 10 (ref 5–15)
BUN: 16 mg/dL (ref 6–20)
CO2: 25 mmol/L (ref 22–32)
Calcium: 9 mg/dL (ref 8.9–10.3)
Chloride: 104 mmol/L (ref 98–111)
Creatinine: 0.95 mg/dL (ref 0.61–1.24)
GFR, Estimated: >60 mL/min (ref >60)
Glucose, Bld: 253 mg/dL — ABNORMAL HIGH (ref 70–99)
Potassium: 4.2 mmol/L (ref 3.5–5.1)
Sodium: 139 mmol/L (ref 135–145)
Total Bilirubin: 0.8 mg/dL (ref 0.3–1.2)
Total Protein: 6.6 g/dL (ref 6.5–8.1)

## 2020-06-23 LAB — CBC WITH DIFFERENTIAL (CANCER CENTER ONLY)
Abs Immature Granulocytes: 0 10*3/uL (ref 0.00–0.07)
Basophils Absolute: 0 10*3/uL (ref 0.0–0.1)
Basophils Relative: 1 %
Eosinophils Absolute: 0.1 10*3/uL (ref 0.0–0.5)
Eosinophils Relative: 3 %
HCT: 46.8 % (ref 39.0–52.0)
Hemoglobin: 16.5 g/dL (ref 13.0–17.0)
Immature Granulocytes: 0 %
Lymphocytes Relative: 31 %
Lymphs Abs: 1 10*3/uL (ref 0.7–4.0)
MCH: 30.8 pg (ref 26.0–34.0)
MCHC: 35.3 g/dL (ref 30.0–36.0)
MCV: 87.3 fL (ref 80.0–100.0)
Monocytes Absolute: 0.3 10*3/uL (ref 0.1–1.0)
Monocytes Relative: 10 %
Neutro Abs: 1.8 10*3/uL (ref 1.7–7.7)
Neutrophils Relative %: 55 %
Platelet Count: 154 10*3/uL (ref 150–400)
RBC: 5.36 MIL/uL (ref 4.22–5.81)
RDW: 12.5 % (ref 11.5–15.5)
WBC Count: 3.3 10*3/uL — ABNORMAL LOW (ref 4.0–10.5)
nRBC: 0 % (ref 0.0–0.2)

## 2020-06-23 LAB — LACTATE DEHYDROGENASE: LDH: 159 U/L (ref 98–192)

## 2020-06-23 NOTE — Progress Notes (Signed)
Hematology and Oncology Follow Up Visit  Barry Mercado [id number] [birth year] 55 y.o. 06/23/2020 10:01 AM       Principle Diagnosis: 55 year old man with left testicular nonseminomatous cancer diagnosed in 2007.  He was found to have stage IS disease with positive tumor markers.   Prior Therapy: 1. Underwent radical orchiectomy in 02/2006 .  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   2. He received 3 cycles of BEP chemotherapy with normalization of his tumor marker.  Therapy ended in April 2008.  Current therapy: Active surveillance.  Interim History:  Barry Mercado is here for return evaluation.  Since the last visit, he reports no major changes in his health.  He denies any recent abdominal pain or pelvic discomfort.  He denies any dysuria, hematuria or discharge.  He underwent neck surgery in September 2021 without any complications.  He has resumed most activities of daily living.         Medications: Reviewed without changes.  Current Outpatient Medications:  .  aspirin-acetaminophen-caffeine (EXCEDRIN MIGRAINE) 250-250-65 MG tablet, Take 2 tablets by mouth every 6 (six) hours as needed for headache., Disp: , Rfl:  .  fluticasone (FLONASE) 50 MCG/ACT nasal spray, Place 1 spray into both nostrils daily as needed for allergies or rhinitis., Disp: , Rfl:  .  ibuprofen (ADVIL,MOTRIN) 200 MG tablet, Take 400 mg by mouth every 6 (six) hours as needed for moderate pain. , Disp: , Rfl:  .  loratadine (CLARITIN) 10 MG tablet, Take 10 mg by mouth daily as needed for allergies., Disp: , Rfl:  .  Menthol, Topical Analgesic, (ZIMS MAX-FREEZE EX), Apply 1 application topically daily as needed (pain)., Disp: , Rfl:  .  oxyCODONE (OXY IR/ROXICODONE) 5 MG immediate release tablet, Take 1 tablet (5 mg total) by mouth every 6 (six) hours as needed for severe pain., Disp: 12 tablet, Rfl: 0  Allergies:  Allergies  Allergen Reactions  . Penicillins Other  (See Comments)    Did it involve swelling of the face/tongue/throat, SOB, or low BP? Unknown Did it involve sudden or severe rash/hives, skin peeling, or any reaction on the inside of your mouth or nose? Unknown Did you need to seek medical attention at a hospital or doctor's office? Unknown When did it last happen?Childhood If all above answers are "NO", may proceed with cephalosporin use.       Physical Exam:   Blood pressure 123/82, pulse 74, temperature (!) 96.7 F (35.9 C), temperature source Tympanic, resp. rate 18, height 5\' 11"  (1.803 m), weight 230 lb 14.4 oz (104.7 kg), SpO2 95 %.    ECOG: 0   General appearance: Comfortable appearing without any discomfort Head: Normocephalic without any trauma Oropharynx: Mucous membranes are moist and pink without any thrush or ulcers. Eyes: Pupils are equal and round reactive to light. Lymph nodes: No cervical, supraclavicular, inguinal or axillary lymphadenopathy.   Heart:regular rate and rhythm.  S1 and S2 without leg edema. Lung: Clear without any rhonchi or wheezes.  No dullness to percussion. Abdomin: Soft, nontender, nondistended with good bowel sounds.  No hepatosplenomegaly. Musculoskeletal: No joint deformity or effusion.  Full range of motion noted. Neurological: No deficits noted on motor, sensory and deep tendon reflex exam. Skin: No petechial rash or dryness.  Appeared moist.  .     Lab Results: Lab Results  Component Value Date   WBC 3.9 (L) 06/23/2019   HGB 17.5 (H) 06/23/2019   HCT 49.7 06/23/2019   MCV 90.5 06/23/2019  PLT 159 06/23/2019     Chemistry      Component Value Date/Time   NA 139 06/23/2019 0858   NA 142 05/24/2016 0816   K 4.5 06/23/2019 0858   K 4.6 05/24/2016 0816   CL 105 06/23/2019 0858   CL 107 05/21/2012 0823   CO2 27 06/23/2019 0858   CO2 28 05/24/2016 0816   BUN 13 06/23/2019 0858   BUN 16.2 05/24/2016 0816   CREATININE 1.16 06/23/2019 0858   CREATININE 1.2  05/24/2016 0816      Component Value Date/Time   CALCIUM 9.1 06/23/2019 0858   CALCIUM 9.3 05/24/2016 0816   ALKPHOS 88 06/23/2019 0858   ALKPHOS 64 05/24/2016 0816   AST 40 06/23/2019 0858   AST 21 05/24/2016 0816   ALT 79 (H) 06/23/2019 0858   ALT 34 05/24/2016 0816   BILITOT 1.2 06/23/2019 0858   BILITOT 0.59 05/24/2016 0816       Impression and Plan:  55 year old man with:  1.  Left testicular seminoma diagnosed in 2007.  Initially presented with stage Ib nonseminomatous tumor with a 60% seminoma, 20% embryonal and 10% teratoma as well as 10% yolk sac.  He developed elevation in his tumor markers required chemotherapy treatment.  He continues to be disease-free at this time after completion of therapy without any evidence of relapse.  Risks and benefits of continued active surveillance were discussed at this time.  Laboratory testing including tumor markers April 2021 was within normal range.  Salvage therapy with systemic chemotherapy will be utilized he has relapsed disease which is less likely at this time.  Developing a second primary neoplasm of the right testicle is also at risk and continue to advise him about self-examination.  2.  Survivorship: He is up-to-date on age-appropriate cancer screening as well as health maintenance.  He is up-to-date on colonoscopy as well as Covid vaccination.   3.  Follow-up: He will return in 1 year for a follow-up visit.  30  minutes were spent on this encounter.  The time was dedicated to reviewing laboratory data, disease status update and future plan of care discussion.   Zola Button, MD 4/15/202210:01 AM

## 2020-06-24 LAB — AFP TUMOR MARKER: AFP, Serum, Tumor Marker: 4.1 ng/mL (ref 0.0–8.4)

## 2020-06-24 LAB — BETA HCG QUANT (REF LAB): hCG Quant: <1 m[IU]/mL (ref 0–3)

## 2020-06-26 ENCOUNTER — Telehealth: Payer: Self-pay | Admitting: Oncology

## 2020-06-26 NOTE — Telephone Encounter (Signed)
Scheduled per 4/18 los, spoke with wife notified of appointment

## 2020-07-01 IMAGING — XA DG INJECT/[PERSON_NAME] INC NEEDLE/CATH/PLC EPI/CERV/THOR W/IMG
2 series · 2 of 2 positions shown · non-contrast
Comparison: none

CLINICAL DATA: Cervical spondylosis without myelopathy. Neck and
right upper extremity pain and numbness.

[Series 1: ortho adipose · 1 of 1 slices shown (1 of 2)]
[im 1/1]
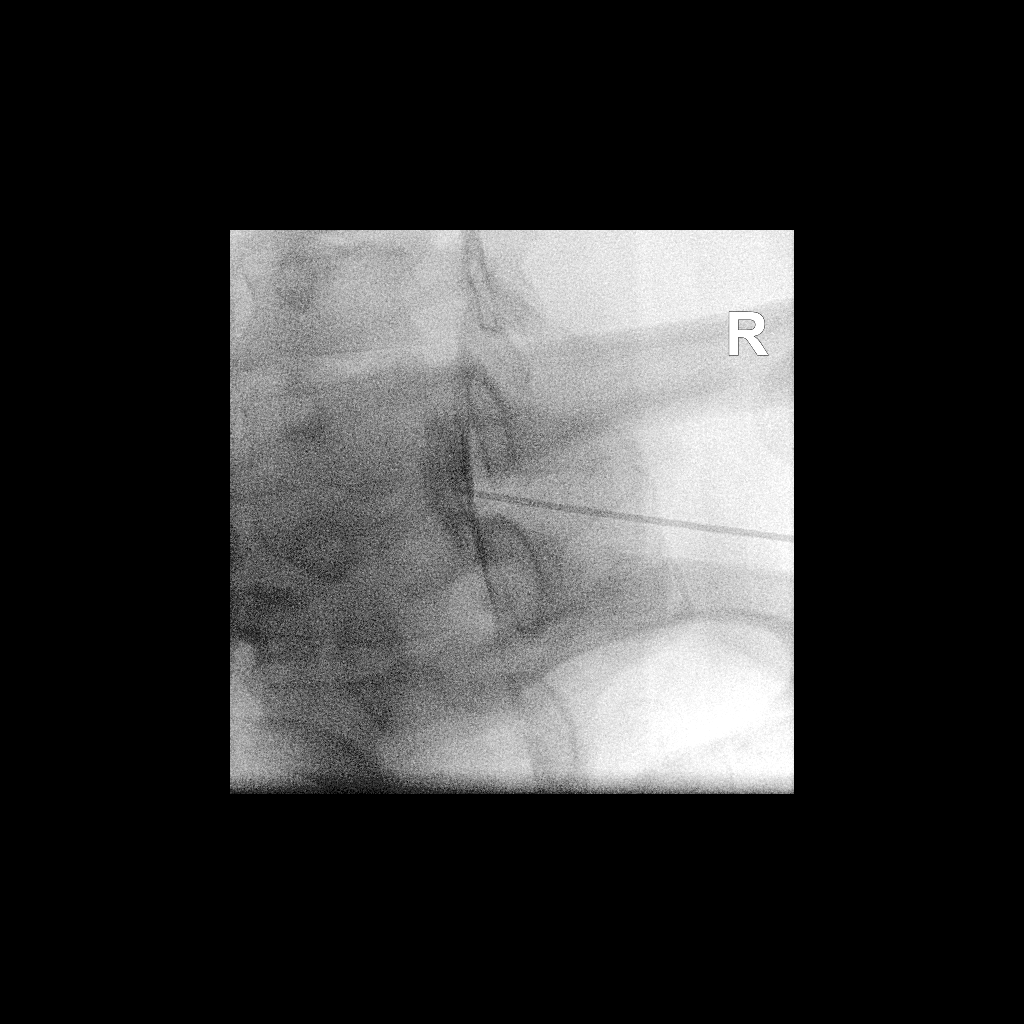

[Series 2: ortho adipose · 1 of 1 slices shown (2 of 2)]
[im 1/1]
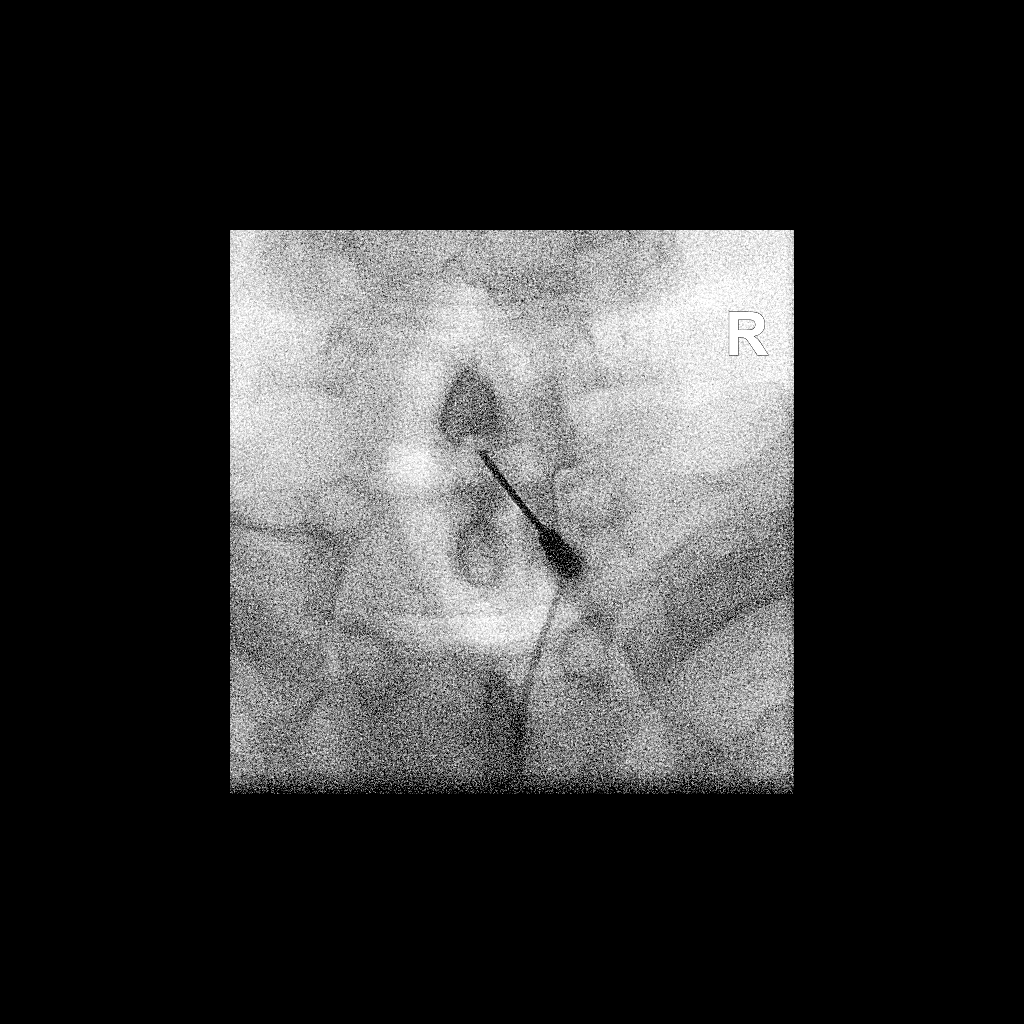

[2 of 2 positions shown; findings below may reference images not displayed]

FLUOROSCOPY TIME:  Fluoroscopy Time: 17 seconds

Radiation Exposure Index: 14.34 microGray*m^2

PROCEDURE:
The procedure, risks, benefits, and alternatives were explained to
the patient. Questions regarding the procedure were encouraged and
answered. The patient understands and consents to the procedure.

CERVICAL EPIDURAL INJECTION

An interlaminar approach was performed on the right at C7-T1. A
inch 20 gauge epidural needle was advanced using loss-of-resistance
technique.

DIAGNOSTIC EPIDURAL INJECTION

Injection of Isovue-M 300 shows a good epidural pattern with spread
above and below the level of needle placement, primarily on the
right. No vascular opacification is seen.

THERAPEUTIC EPIDURAL INJECTION

1.5 ml of Kenalog 40 mixed with 2 ml of normal saline were then
instilled. The procedure was well-tolerated, and the patient was
discharged thirty minutes following the injection in good condition.
IMPRESSION: Technically successful interlaminar epidural injection on the right
at C7-T1.

## 2021-01-08 ENCOUNTER — Encounter: Payer: No Typology Code available for payment source | Attending: Physician Assistant | Admitting: Dietician

## 2021-01-08 ENCOUNTER — Other Ambulatory Visit: Payer: Self-pay

## 2021-01-08 ENCOUNTER — Encounter: Payer: Self-pay | Admitting: Dietician

## 2021-01-08 VITALS — Ht 71.0 in | Wt 224.7 lb

## 2021-01-08 DIAGNOSIS — E119 Type 2 diabetes mellitus without complications: Secondary | ICD-10-CM | POA: Diagnosis not present

## 2021-01-08 NOTE — Progress Notes (Signed)
Diabetes Self-Management Education  Visit Type: First/Initial  Appt. Start Time: 1410 Appt. End Time: 0947  01/08/2021  [name], identified by name and date of birth, is a 55 y.o. male with a diagnosis of Diabetes: Type 2.   ASSESSMENT Pt is taking metformin BID, no side effects. Pt is looking for dietary advice for meal planning and structure. Pt reports going to get a term-life insurance policy and got blood tested then. This was when they found their A1c to be 8.5 and denied pt policy. Pt styates this was a huge motivator for them to get their diabetes under control. Pt had A1c rechecked October 25th and it had dropped down to 5.7 Pt has cut out soda, sugars, potatoes, pasta, rice. Pt reports losing 10 lbs since August through diet, exercise, and metformin. Pt reports getting tired of eating so few carbs, states it is not sustainable. Pt is doing a combination of cardio resistance training 3 days a week (M,W,F) for 90-120 minutes. Started off as physical therapy rehab for herniated discs in their neck. Pt stays active doing yard work.  Height 5\' 11"  (1.803 m), weight 224 lb 11.2 oz (101.9 kg). Body mass index is 31.34 kg/m.   Diabetes Self-Management Education - 01/08/21 1420       Visit Information   Visit Type First/Initial      Initial Visit   Diabetes Type Type 2    Are you currently following a meal plan? Yes    What type of meal plan do you follow? Low sugar, low carb    Are you taking your medications as prescribed? Yes    Date Diagnosed August 2022      Health Coping   How would you rate your overall health? Good      Psychosocial Assessment   Patient Belief/Attitude about Diabetes Motivated to manage diabetes    Self-care barriers None    Self-management support Doctor's office    Other persons present Patient    Patient Concerns Nutrition/Meal planning    Special Needs None    Preferred Learning Style No preference indicated    Learning Readiness  Change in progress    How often do you need to have someone help you when you read instructions, pamphlets, or other written materials from your doctor or pharmacy? 1 - Never    What is the last grade level you completed in school? College graduate      Pre-Education Assessment   Patient understands the diabetes disease and treatment process. Needs Instruction    Patient understands incorporating nutritional management into lifestyle. Needs Review    Patient undertands incorporating physical activity into lifestyle. Needs Review    Patient understands using medications safely. Needs Review    Patient understands monitoring blood glucose, interpreting and using results --    Patient understands prevention, detection, and treatment of acute complications. --    Patient understands prevention, detection, and treatment of chronic complications. Needs Instruction    Patient understands how to develop strategies to address psychosocial issues. Needs Instruction    Patient understands how to develop strategies to promote health/change behavior. Needs Instruction      Complications   Last HgB A1C per patient/outside source 5.7 %   01/02/2021. Pt was diagnosed at 8.5 in August 2022.   How often do you check your blood sugar? 0 times/day (not testing)    Number of hypoglycemic episodes per month 0    Number of hyperglycemic episodes per week 0  Have you had a dilated eye exam in the past 12 months? Yes    Have you had a dental exam in the past 12 months? Yes    Are you checking your feet? Yes    How many days per week are you checking your feet? 7      Dietary Intake   Breakfast 1 apple    Lunch [place] [employer] roast beef sub, lettuce, tomato, onion, pickles, mayonnaise, cheese, oil and vinegar    Dinner Green beans, grilled beef short ribs    Beverage(s) water, unsweet tea      Exercise   Exercise Type ADL's;Moderate (swimming / aerobic walking)    How many days per week to you exercise? 4     How many minutes per day do you exercise? 90    Total minutes per week of exercise 360      Patient Education   Previous Diabetes Education No    Disease state  Factors that contribute to the development of diabetes    Nutrition management  Carbohydrate counting;Food label reading, portion sizes and measuring food.;Role of diet in the treatment of diabetes and the relationship between the three main macronutrients and blood glucose level;Information on hints to eating out and maintain blood glucose control.    Physical activity and exercise  Role of exercise on diabetes management, blood pressure control and cardiac health.    Medications Reviewed patients medication for diabetes, action, purpose, timing of dose and side effects.    Chronic complications Relationship between chronic complications and blood glucose control    Psychosocial adjustment Role of stress on diabetes;Worked with patient to identify barriers to care and solutions;Identified and addressed patients feelings and concerns about diabetes      Individualized Goals (developed by patient)   Nutrition Follow meal plan discussed    Physical Activity Exercise 3-5 times per week    Medications take my medication as prescribed    Monitoring  Not Applicable      Post-Education Assessment   Patient understands the diabetes disease and treatment process. Needs Review    Patient understands incorporating nutritional management into lifestyle. Needs Review    Patient undertands incorporating physical activity into lifestyle. Needs Review    Patient understands using medications safely. Needs Review    Patient understands prevention, detection, and treatment of chronic complications. Needs Review    Patient understands how to develop strategies to address psychosocial issues. Needs Review    Patient understands how to develop strategies to promote health/change behavior. Needs Review      Outcomes   Expected Outcomes Demonstrated  interest in learning. Expect positive outcomes    Future DMSE 3-4 months             Individualized Plan for Diabetes Self-Management Training:   Learning Objective:  Patient will have a greater understanding of diabetes self-management. Patient education plan is to attend individual and/or group sessions per assessed needs and concerns.   Plan:   Patient Instructions  Work towards eating three meals a day, about 5-6 hours apart!  Begin to recognize carbohydrates in your food choices!  Have 3 carb choices at each meal (45 g).   Begin to build your meals using the proportions of the Balanced Plate. First, select your carb choice(s) for the meal, and determine how much you should have to equal 3 carb choices (45 g). Next, select your source of protein to pair with your carb choice(s). Finally, complete the remaining half of  your meal with a variety of non-starchy vegetables.  Keep up your physical activity at your current rate!  Great job on the changes that you have already made!!    Expected Outcomes:  Demonstrated interest in learning. Expect positive outcomes  Education material provided: Meal plan card, My Plate, and Carbohydrate counting sheet  If problems or questions, patient to contact team via:  Phone and Email  Future DSME appointment: 3-4 months

## 2021-01-08 NOTE — Patient Instructions (Signed)
Work towards eating three meals a day, about 5-6 hours apart!  Begin to recognize carbohydrates in your food choices!  Have 3 carb choices at each meal (45 g).   Begin to build your meals using the proportions of the Balanced Plate. First, select your carb choice(s) for the meal, and determine how much you should have to equal 3 carb choices (45 g). Next, select your source of protein to pair with your carb choice(s). Finally, complete the remaining half of your meal with a variety of non-starchy vegetables.  Keep up your physical activity at your current rate!  Great job on the changes that you have already made!!

## 2021-04-11 ENCOUNTER — Encounter: Payer: No Typology Code available for payment source | Attending: Physician Assistant | Admitting: Dietician

## 2021-04-11 ENCOUNTER — Encounter: Payer: Self-pay | Admitting: Dietician

## 2021-04-11 ENCOUNTER — Other Ambulatory Visit: Payer: Self-pay

## 2021-04-11 VITALS — Ht 71.0 in | Wt 224.7 lb

## 2021-04-11 DIAGNOSIS — E119 Type 2 diabetes mellitus without complications: Secondary | ICD-10-CM | POA: Insufficient documentation

## 2021-04-11 NOTE — Progress Notes (Signed)
Diabetes Self-Management Education  Visit Type: Follow-up  Appt. Start Time: 0800 Appt. End Time: 0830  04/11/2021  [name], identified by name and date of birth, is a 56 y.o. male with a diagnosis of Diabetes:  .   ASSESSMENT Pt A1c is now down to 5.4 from 5.7. Pt has been moved to metformin QD from BID. Pt reports trying to stick to 45g of carbs per meal, may have a little less for breakfast and a little more at dinner. Pt has been exercising at least 3 times a week for 90 minutes. Pt was taken off of their statin due to side effects, LDL is slightly elevated to 120 mg/dL. Pt would like to lower cholesterol with dietary measures. Pt will reapply for their term-life insurance next month, this has been a big motivator for their success in making changes.  Height 5\' 11"  (1.803 m), weight 224 lb 11.2 oz (101.9 kg). Body mass index is 31.34 kg/m.   Diabetes Self-Management Education - 04/11/21 9371       Visit Information   Visit Type Follow-up      Psychosocial Assessment   Patient Concerns Nutrition/Meal planning;Healthy Lifestyle      Complications   Last HgB A1C per patient/outside source 5.4 %   04/04/2021     Dietary Intake   Breakfast Apple, english muffin with PB    Lunch 2 Chili dogs    Dinner Beef tips, vegetable medley, salad    Beverage(s) water, unsweetened iced tea      Exercise   Exercise Type ADL's;Moderate (swimming / aerobic walking)    How many days per week to you exercise? 4    How many minutes per day do you exercise? 90    Total minutes per week of exercise 360      Patient Self-Evaluation of Goals - Patient rates self as meeting previously set goals (% of time)   Nutrition >75%    Physical Activity >75%    Medications >75%    Monitoring Not Applicable    Problem Solving >75%    Reducing Risk >75%    Health Coping >75%      Post-Education Assessment   Patient understands the diabetes disease and treatment process. Demonstrates  understanding / competency    Patient understands incorporating nutritional management into lifestyle. Demonstrates understanding / competency    Patient undertands incorporating physical activity into lifestyle. Demonstrates understanding / competency    Patient understands using medications safely. Demonstrates understanding / competency    Patient understands prevention, detection, and treatment of chronic complications. Demonstrates understanding / competency    Patient understands how to develop strategies to address psychosocial issues. Demonstrates understanding / competency    Patient understands how to develop strategies to promote health/change behavior. Demonstrates understanding / competency      Outcomes   Expected Outcomes Demonstrated interest in learning. Expect positive outcomes    Future DMSE 6 months    Program Status Not Completed      Subsequent Visit   Since your last visit have you continued or begun to take your medications as prescribed? Yes    Since your last visit have you had your blood pressure checked? Yes    Is your most recent blood pressure lower, unchanged, or higher since your last visit? Unchanged    Since your last visit have you experienced any weight changes? No change    Since your last visit, are you checking your blood glucose at least once a  day? N/A             Individualized Plan for Diabetes Self-Management Training:   Learning Objective:  Patient will have a greater understanding of diabetes self-management. Patient education plan is to attend individual and/or group sessions per assessed needs and concerns.   Plan:   Patient Instructions  Make sure to always have a source of protein with your breakfast as often as possible.  Choose Farilife brand milk for a higher protein, lower carb option.  Use a combination of these three strategies to effectively lower your consumption of saturated fats 1) Consume a smaller portion when having  animal products 2) Consume animal products less frequently 3) Find a reduced or low fat version of these foods.  Aim for 5-10 g of soluble fiber a day. If you feel that you can't get this through your diet, have one serving of Benefiber or Metamucil daily.  Look for no more than 7% daily value of saturated fat on your packaged and processed foods.  Expected Outcomes:  Demonstrated interest in learning. Expect positive outcomes  Education material provided: Foods to Lower Your Cholesterol  If problems or questions, patient to contact team via:  Phone and Email  Future DSME appointment: 6 months

## 2021-04-11 NOTE — Patient Instructions (Signed)
Make sure to always have a source of protein with your breakfast as often as possible.  Choose Farilife brand milk for a higher protein, lower carb option.  Use a combination of these three strategies to effectively lower your consumption of saturated fats 1) Consume a smaller portion when having animal products 2) Consume animal products less frequently 3) Find a reduced or low fat version of these foods.  Aim for 5-10 g of soluble fiber a day. If you feel that you can't get this through your diet, have one serving of Benefiber or Metamucil daily.  Look for no more than 7% daily value of saturated fat on your packaged and processed foods.

## 2021-06-26 ENCOUNTER — Inpatient Hospital Stay (HOSPITAL_BASED_OUTPATIENT_CLINIC_OR_DEPARTMENT_OTHER): Payer: No Typology Code available for payment source | Admitting: Oncology

## 2021-06-26 ENCOUNTER — Other Ambulatory Visit: Payer: Self-pay

## 2021-06-26 ENCOUNTER — Inpatient Hospital Stay: Payer: No Typology Code available for payment source | Attending: Oncology

## 2021-06-26 VITALS — BP 115/76 | HR 75 | Temp 97.9°F | Resp 18 | Ht 71.0 in | Wt 225.7 lb

## 2021-06-26 DIAGNOSIS — C6292 Malignant neoplasm of left testis, unspecified whether descended or undescended: Secondary | ICD-10-CM | POA: Diagnosis not present

## 2021-06-26 DIAGNOSIS — Z8547 Personal history of malignant neoplasm of testis: Secondary | ICD-10-CM | POA: Diagnosis present

## 2021-06-26 DIAGNOSIS — Z9221 Personal history of antineoplastic chemotherapy: Secondary | ICD-10-CM | POA: Insufficient documentation

## 2021-06-26 LAB — CBC WITH DIFFERENTIAL (CANCER CENTER ONLY)
Abs Immature Granulocytes: 0.01 10*3/uL (ref 0.00–0.07)
Basophils Absolute: 0 10*3/uL (ref 0.0–0.1)
Basophils Relative: 1 %
Eosinophils Absolute: 0.1 10*3/uL (ref 0.0–0.5)
Eosinophils Relative: 3 %
HCT: 47.7 % (ref 39.0–52.0)
Hemoglobin: 16.7 g/dL (ref 13.0–17.0)
Immature Granulocytes: 0 %
Lymphocytes Relative: 35 %
Lymphs Abs: 1.2 10*3/uL (ref 0.7–4.0)
MCH: 31.3 pg (ref 26.0–34.0)
MCHC: 35 g/dL (ref 30.0–36.0)
MCV: 89.5 fL (ref 80.0–100.0)
Monocytes Absolute: 0.4 10*3/uL (ref 0.1–1.0)
Monocytes Relative: 11 %
Neutro Abs: 1.6 10*3/uL — ABNORMAL LOW (ref 1.7–7.7)
Neutrophils Relative %: 50 %
Platelet Count: 144 10*3/uL — ABNORMAL LOW (ref 150–400)
RBC: 5.33 MIL/uL (ref 4.22–5.81)
RDW: 12.3 % (ref 11.5–15.5)
WBC Count: 3.3 10*3/uL — ABNORMAL LOW (ref 4.0–10.5)
nRBC: 0 % (ref 0.0–0.2)

## 2021-06-26 LAB — CMP (CANCER CENTER ONLY)
ALT: 35 U/L (ref 0–44)
AST: 22 U/L (ref 15–41)
Albumin: 4.2 g/dL (ref 3.5–5.0)
Alkaline Phosphatase: 46 U/L (ref 38–126)
Anion gap: 5 (ref 5–15)
BUN: 21 mg/dL — ABNORMAL HIGH (ref 6–20)
CO2: 25 mmol/L (ref 22–32)
Calcium: 8.6 mg/dL — ABNORMAL LOW (ref 8.9–10.3)
Chloride: 108 mmol/L (ref 98–111)
Creatinine: 0.97 mg/dL (ref 0.61–1.24)
GFR, Estimated: >60 mL/min (ref >60)
Glucose, Bld: 134 mg/dL — ABNORMAL HIGH (ref 70–99)
Potassium: 4.4 mmol/L (ref 3.5–5.1)
Sodium: 138 mmol/L (ref 135–145)
Total Bilirubin: 0.6 mg/dL (ref 0.3–1.2)
Total Protein: 6.8 g/dL (ref 6.5–8.1)

## 2021-06-26 LAB — LACTATE DEHYDROGENASE: LDH: 127 U/L (ref 98–192)

## 2021-06-26 NOTE — Progress Notes (Signed)
Hematology and Oncology Follow Up Visit ? ?Barry Mercado ?[id number] ?Feb 10, 1966 56 y.o. ?06/26/2021 8:34 AM ? ?   ? ? 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 ? ? ?Prior Therapy: ?Underwent radical orchiectomy in 02/2006 .  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   ?He received 3 cycles of BEP chemotherapy with normalization of his tumor marker.  Therapy ended in April 2008. ? ?Current therapy: Active surveillance. ? ?Interim History:  Barry Mercado returns today for a follow-up visit.  Since last visit, he reports a feeling well without any major complaints.  He remains physically active and continues to attend to activities of daily living.  He denies any recent hospitalizations or illnesses.  He denies any bone pain or pathological fractures.  He is trying to lose weight and attempting to stop metformin. ? ? ? ? ? ? ? ? ?Medications: Reviewed without changes.  ?Current Outpatient Medications:  ?  aspirin-acetaminophen-caffeine (EXCEDRIN MIGRAINE) 250-250-65 MG tablet, Take 2 tablets by mouth every 6 (six) hours as needed for headache., Disp: , Rfl:  ?  atorvastatin (LIPITOR) 10 MG tablet, Take 10 mg by mouth at bedtime. (Patient not taking: Reported on 01/08/2021), Disp: , Rfl:  ?  fluticasone (FLONASE) 50 MCG/ACT nasal spray, Place 1 spray into both nostrils daily as needed for allergies or rhinitis., Disp: , Rfl:  ?  ibuprofen (ADVIL,MOTRIN) 200 MG tablet, Take 400 mg by mouth every 6 (six) hours as needed for moderate pain. , Disp: , Rfl:  ?  lisinopril (ZESTRIL) 5 MG tablet, Take 5 mg by mouth daily., Disp: , Rfl:  ?  loratadine (CLARITIN) 10 MG tablet, Take 10 mg by mouth daily as needed for allergies., Disp: , Rfl:  ?  Menthol, Topical Analgesic, (ZIMS MAX-FREEZE EX), Apply 1 application topically daily as needed (pain)., Disp: , Rfl:  ?   metFORMIN (GLUCOPHAGE-XR) 500 MG 24 hr tablet, Take by mouth., Disp: , Rfl:  ?  oxyCODONE (OXY IR/ROXICODONE) 5 MG immediate release tablet, Take 1 tablet (5 mg total) by mouth every 6 (six) hours as needed for severe pain., Disp: 12 tablet, Rfl: 0 ? ?Allergies:  ?Allergies  ?Allergen Reactions  ? Penicillins Other (See Comments)  ?  Did it involve swelling of the face/tongue/throat, SOB, or low BP? Unknown ?Did it involve sudden or severe rash/hives, skin peeling, or any reaction on the inside of your mouth or nose? Unknown ?Did you need to seek medical attention at a hospital or doctor's office? Unknown ?When did it last happen?      Childhood ?If all above answers are ?NO?, may proceed with cephalosporin use. ?  ? ? ? ? ?Physical Exam: ? ? ?Blood pressure 115/76, pulse 75, temperature 97.9 ?F (36.6 ?C), temperature source Temporal, resp. rate 18, height '5\' 11"'$  (1.803 m), weight 225 lb 11.2 oz (102.4 kg), SpO2 99 %. ? ? ? ? ?ECOG: 0 ? ? ?General appearance: Alert, awake without any distress. ?Head: Atraumatic without abnormalities ?Oropharynx: Without any thrush or ulcers. ?Eyes: No scleral icterus. ?Lymph nodes: No lymphadenopathy noted in the cervical, supraclavicular, or axillary nodes ?Heart:regular rate and rhythm, without any murmurs or gallops.   ?Lung: Clear to auscultation without any rhonchi, wheezes or dullness to percussion. ?Abdomin: Soft, nontender without any shifting dullness or ascites. ?Musculoskeletal: No clubbing or cyanosis. ?Neurological: No motor or sensory deficits. ?Skin: No rashes or  lesions. ? ?. ? ? ? ? ?Lab Results: ?Lab Results  ?Component Value Date  ? WBC 3.3 (L) 06/23/2020  ? HGB 16.5 06/23/2020  ? HCT 46.8 06/23/2020  ? MCV 87.3 06/23/2020  ? PLT 154 06/23/2020  ? ?  Chemistry   ?   ?Component Value Date/Time  ? NA 139 06/23/2020 1001  ? NA 142 05/24/2016 0816  ? K 4.2 06/23/2020 1001  ? K 4.6 05/24/2016 0816  ? CL 104 06/23/2020 1001  ? CL 107 05/21/2012 0823  ? CO2 25 06/23/2020  1001  ? CO2 28 05/24/2016 0816  ? BUN 16 06/23/2020 1001  ? BUN 16.2 05/24/2016 0816  ? CREATININE 0.95 06/23/2020 1001  ? CREATININE 1.2 05/24/2016 0816  ?    ?Component Value Date/Time  ? CALCIUM 9.0 06/23/2020 1001  ? CALCIUM 9.3 05/24/2016 0816  ? ALKPHOS 69 06/23/2020 1001  ? ALKPHOS 64 05/24/2016 0816  ? AST 22 06/23/2020 1001  ? AST 21 05/24/2016 0816  ? ALT 46 (H) 06/23/2020 1001  ? ALT 34 05/24/2016 0816  ? BILITOT 0.8 06/23/2020 1001  ? BILITOT 0.59 05/24/2016 0816  ?  ? ? ? ?Impression and Plan: ? ?56 year old man with: ? ?1.  Stage I as left testicular noneseminoma diagnosed in 2007.  He initially presented with stage Ib nwith a 60% seminoma, 20% embryonal and 10% teratoma as well as 10% yolk sac and elevated tumor markers. ? ?He remains on active surveillance without any evidence of relapsed disease at this time.  Risks and benefits of continued active surveillance were reviewed at this time.  I see no indication for repeat imaging studies at this time. ? ?2.  Survivorship: Delayed complications related to chemotherapy were reviewed.  Survivorship issues were also discussed.  He is up-to-date on age-appropriate cancer screening. ? ? ?3.  Follow-up: In 1 year for repeat evaluation. ? ?20  minutes were spent on this visit.  The time was dedicated to reviewing laboratory data, disease status update and outlining future plan of care discussion. ? ? ?Zola Button, MD ?4/18/20238:34 AM  ?

## 2021-06-27 LAB — BETA HCG QUANT (REF LAB): hCG Quant: <1 m[IU]/mL (ref 0–3)

## 2021-06-27 LAB — AFP TUMOR MARKER: AFP, Serum, Tumor Marker: 3.4 ng/mL (ref 0.0–8.4)

## 2021-09-12 ENCOUNTER — Encounter: Payer: No Typology Code available for payment source | Admitting: Dietician

## 2021-10-17 ENCOUNTER — Telehealth: Payer: Self-pay | Admitting: Oncology

## 2021-10-17 NOTE — Telephone Encounter (Signed)
Called patient regarding upcoming 2024 appointments, spoke with patient's spouse. Patient will be notified.  

## 2022-03-26 ENCOUNTER — Telehealth: Payer: Self-pay | Admitting: Oncology

## 2022-03-26 NOTE — Telephone Encounter (Signed)
Called patient regarding providers departure, patient is notified. Rescheduled patient with new provider.

## 2022-06-19 ENCOUNTER — Encounter: Payer: Self-pay | Admitting: Nurse Practitioner

## 2022-06-20 ENCOUNTER — Other Ambulatory Visit: Payer: Self-pay | Admitting: *Deleted

## 2022-06-20 DIAGNOSIS — C6292 Malignant neoplasm of left testis, unspecified whether descended or undescended: Secondary | ICD-10-CM

## 2022-06-20 DIAGNOSIS — E119 Type 2 diabetes mellitus without complications: Secondary | ICD-10-CM

## 2022-06-25 ENCOUNTER — Other Ambulatory Visit: Payer: No Typology Code available for payment source

## 2022-06-25 ENCOUNTER — Ambulatory Visit: Payer: No Typology Code available for payment source | Admitting: Oncology

## 2022-06-25 NOTE — Progress Notes (Unsigned)
  [facility] OFFICE PROGRESS NOTE   Diagnosis: History of testicular cancer  INTERVAL HISTORY:   Barry Mercado is a 57 year old man previously followed by Dr. Clelia Croft for testicular cancer diagnosed in 2007.  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  He completed 3 cycles of BEP chemotherapy.  Objective:  Vital signs in last 24 hours:  There were no vitals taken for this visit.    HEENT: *** Lymphatics: *** Resp: *** Cardio: *** GI: *** Vascular: *** Neuro: ***  Skin: ***    Lab Results:  Lab Results  Component Value Date   WBC 3.3 (L) 06/26/2021   HGB 16.7 06/26/2021   HCT 47.7 06/26/2021   MCV 89.5 06/26/2021   PLT 144 (L) 06/26/2021   NEUTROABS 1.6 (L) 06/26/2021    Imaging:  No results found.  Medications: I have reviewed the patient's current medications.  Assessment/Plan:   Disposition:    Lonna Cobb ANP/GNP-BC   06/25/2022  3:00 PM

## 2022-06-26 ENCOUNTER — Inpatient Hospital Stay: Payer: Commercial Managed Care - PPO | Attending: Nurse Practitioner

## 2022-06-26 ENCOUNTER — Encounter: Payer: Self-pay | Admitting: Nurse Practitioner

## 2022-06-26 ENCOUNTER — Inpatient Hospital Stay (HOSPITAL_BASED_OUTPATIENT_CLINIC_OR_DEPARTMENT_OTHER): Payer: Commercial Managed Care - PPO | Admitting: Nurse Practitioner

## 2022-06-26 VITALS — BP 121/81 | HR 62 | Temp 98.2°F | Resp 20 | Ht 71.0 in | Wt 236.0 lb

## 2022-06-26 DIAGNOSIS — E119 Type 2 diabetes mellitus without complications: Secondary | ICD-10-CM

## 2022-06-26 DIAGNOSIS — Z8547 Personal history of malignant neoplasm of testis: Secondary | ICD-10-CM | POA: Insufficient documentation

## 2022-06-26 DIAGNOSIS — C6292 Malignant neoplasm of left testis, unspecified whether descended or undescended: Secondary | ICD-10-CM

## 2022-06-26 DIAGNOSIS — Z9221 Personal history of antineoplastic chemotherapy: Secondary | ICD-10-CM | POA: Diagnosis not present

## 2022-06-26 LAB — CBC WITH DIFFERENTIAL (CANCER CENTER ONLY)
Abs Immature Granulocytes: 0.01 10*3/uL (ref 0.00–0.07)
Basophils Absolute: 0 10*3/uL (ref 0.0–0.1)
Basophils Relative: 1 %
Eosinophils Absolute: 0.1 10*3/uL (ref 0.0–0.5)
Eosinophils Relative: 4 %
HCT: 48.3 % (ref 39.0–52.0)
Hemoglobin: 17 g/dL (ref 13.0–17.0)
Immature Granulocytes: 0 %
Lymphocytes Relative: 38 %
Lymphs Abs: 1.4 10*3/uL (ref 0.7–4.0)
MCH: 31.6 pg (ref 26.0–34.0)
MCHC: 35.2 g/dL (ref 30.0–36.0)
MCV: 89.8 fL (ref 80.0–100.0)
Monocytes Absolute: 0.5 10*3/uL (ref 0.1–1.0)
Monocytes Relative: 12 %
Neutro Abs: 1.7 10*3/uL (ref 1.7–7.7)
Neutrophils Relative %: 45 %
Platelet Count: 171 10*3/uL (ref 150–400)
RBC: 5.38 MIL/uL (ref 4.22–5.81)
RDW: 12.3 % (ref 11.5–15.5)
WBC Count: 3.8 10*3/uL — ABNORMAL LOW (ref 4.0–10.5)
nRBC: 0 % (ref 0.0–0.2)

## 2022-06-26 LAB — CMP (CANCER CENTER ONLY)
ALT: 38 U/L (ref 0–44)
AST: 23 U/L (ref 15–41)
Albumin: 4.6 g/dL (ref 3.5–5.0)
Alkaline Phosphatase: 55 U/L (ref 38–126)
Anion gap: 6 (ref 5–15)
BUN: 18 mg/dL (ref 6–20)
CO2: 29 mmol/L (ref 22–32)
Calcium: 9.6 mg/dL (ref 8.9–10.3)
Chloride: 104 mmol/L (ref 98–111)
Creatinine: 1.13 mg/dL (ref 0.61–1.24)
GFR, Estimated: >60 mL/min (ref >60)
Glucose, Bld: 129 mg/dL — ABNORMAL HIGH (ref 70–99)
Potassium: 4.7 mmol/L (ref 3.5–5.1)
Sodium: 139 mmol/L (ref 135–145)
Total Bilirubin: 1.2 mg/dL (ref 0.3–1.2)
Total Protein: 7.3 g/dL (ref 6.5–8.1)

## 2022-06-26 LAB — HEMOGLOBIN A1C
Hgb A1c MFr Bld: 6.4 % — ABNORMAL HIGH (ref 4.8–5.6)
Mean Plasma Glucose: 136.98 mg/dL

## 2022-06-26 LAB — LACTATE DEHYDROGENASE: LDH: 170 U/L (ref 98–192)

## 2022-06-28 ENCOUNTER — Telehealth: Payer: Self-pay

## 2022-06-28 LAB — AFP TUMOR MARKER: AFP, Serum, Tumor Marker: 4 ng/mL (ref 0.0–8.4)

## 2022-06-28 LAB — BETA HCG QUANT (REF LAB): hCG Quant: <1 m[IU]/mL (ref 0–3)

## 2022-06-28 NOTE — Telephone Encounter (Signed)
Patient gave verbal understanding and had no further questions or concerns  

## 2022-06-28 NOTE — Telephone Encounter (Signed)
-----   Message from Rana Snare, NP sent at 06/28/2022  8:12 AM EDT ----- Please let him know tumor markers are normal.  Follow-up as scheduled.

## 2023-06-20 ENCOUNTER — Other Ambulatory Visit: Payer: Self-pay

## 2023-06-20 DIAGNOSIS — C6292 Malignant neoplasm of left testis, unspecified whether descended or undescended: Secondary | ICD-10-CM

## 2023-06-23 ENCOUNTER — Inpatient Hospital Stay: Payer: Commercial Managed Care - PPO | Attending: Oncology | Admitting: Oncology

## 2023-06-23 ENCOUNTER — Other Ambulatory Visit: Payer: Commercial Managed Care - PPO

## 2023-06-23 ENCOUNTER — Inpatient Hospital Stay: Payer: Self-pay

## 2023-06-23 VITALS — BP 127/81 | HR 69 | Temp 98.2°F | Resp 18 | Ht 71.0 in | Wt 225.7 lb

## 2023-06-23 DIAGNOSIS — Z08 Encounter for follow-up examination after completed treatment for malignant neoplasm: Secondary | ICD-10-CM | POA: Insufficient documentation

## 2023-06-23 DIAGNOSIS — C6292 Malignant neoplasm of left testis, unspecified whether descended or undescended: Secondary | ICD-10-CM | POA: Diagnosis not present

## 2023-06-23 DIAGNOSIS — Z8547 Personal history of malignant neoplasm of testis: Secondary | ICD-10-CM | POA: Insufficient documentation

## 2023-06-23 DIAGNOSIS — Z9221 Personal history of antineoplastic chemotherapy: Secondary | ICD-10-CM | POA: Insufficient documentation

## 2023-06-23 DIAGNOSIS — E119 Type 2 diabetes mellitus without complications: Secondary | ICD-10-CM | POA: Insufficient documentation

## 2023-06-23 LAB — CMP (CANCER CENTER ONLY)
ALT: 28 U/L (ref 0–44)
AST: 19 U/L (ref 15–41)
Albumin: 4.6 g/dL (ref 3.5–5.0)
Alkaline Phosphatase: 58 U/L (ref 38–126)
Anion gap: 7 (ref 5–15)
BUN: 18 mg/dL (ref 6–20)
CO2: 29 mmol/L (ref 22–32)
Calcium: 9.8 mg/dL (ref 8.9–10.3)
Chloride: 104 mmol/L (ref 98–111)
Creatinine: 1.03 mg/dL (ref 0.61–1.24)
GFR, Estimated: >60 mL/min (ref >60)
Glucose, Bld: 90 mg/dL (ref 70–99)
Potassium: 4.9 mmol/L (ref 3.5–5.1)
Sodium: 140 mmol/L (ref 135–145)
Total Bilirubin: 0.6 mg/dL (ref 0.0–1.2)
Total Protein: 7.4 g/dL (ref 6.5–8.1)

## 2023-06-23 LAB — CBC WITH DIFFERENTIAL (CANCER CENTER ONLY)
Abs Immature Granulocytes: 0.01 10*3/uL (ref 0.00–0.07)
Basophils Absolute: 0 10*3/uL (ref 0.0–0.1)
Basophils Relative: 1 %
Eosinophils Absolute: 0.1 10*3/uL (ref 0.0–0.5)
Eosinophils Relative: 4 %
HCT: 49.6 % (ref 39.0–52.0)
Hemoglobin: 17.4 g/dL — ABNORMAL HIGH (ref 13.0–17.0)
Immature Granulocytes: 0 %
Lymphocytes Relative: 35 %
Lymphs Abs: 1.4 10*3/uL (ref 0.7–4.0)
MCH: 31.4 pg (ref 26.0–34.0)
MCHC: 35.1 g/dL (ref 30.0–36.0)
MCV: 89.5 fL (ref 80.0–100.0)
Monocytes Absolute: 0.6 10*3/uL (ref 0.1–1.0)
Monocytes Relative: 15 %
Neutro Abs: 1.8 10*3/uL (ref 1.7–7.7)
Neutrophils Relative %: 45 %
Platelet Count: 154 10*3/uL (ref 150–400)
RBC: 5.54 MIL/uL (ref 4.22–5.81)
RDW: 12.4 % (ref 11.5–15.5)
WBC Count: 3.9 10*3/uL — ABNORMAL LOW (ref 4.0–10.5)
nRBC: 0 % (ref 0.0–0.2)

## 2023-06-23 LAB — LACTATE DEHYDROGENASE: LDH: 147 U/L (ref 98–192)

## 2023-06-23 NOTE — Progress Notes (Signed)
  [facility] OFFICE PROGRESS NOTE   Diagnosis: Testicular cancer  INTERVAL HISTORY:   Barry Mercado returns as scheduled.  He feels well.  Good appetite.  He reports intentional weight loss by changing his diet.  He was diagnosed with diabetes and was able to discontinue metformin after weight loss. No change in the right testicle.  Objective:  Vital signs in last 24 hours:  Blood pressure 127/81, pulse 69, temperature 98.2 F (36.8 C), temperature source Temporal, resp. rate 18, height 5\' 11"  (1.803 m), weight 225 lb 11.2 oz (102.4 kg), SpO2 100%.   Lymphatics: No cervical, supraclavicular, axillary, or inguinal nodes Resp: Lungs clear bilaterally Cardio: Regular rate and rhythm GI: No hepatosplenomegaly, nontender, no mass GU: Status post left orchiectomy.  Right testicle without mass. Vascular: No leg edema   Lab Results:  Lab Results  Component Value Date   WBC 3.9 (L) 06/23/2023   HGB 17.4 (H) 06/23/2023   HCT 49.6 06/23/2023   MCV 89.5 06/23/2023   PLT 154 06/23/2023   NEUTROABS 1.8 06/23/2023    CMP  Lab Results  Component Value Date   NA 139 06/26/2022   K 4.7 06/26/2022   CL 104 06/26/2022   CO2 29 06/26/2022   GLUCOSE 129 (H) 06/26/2022   BUN 18 06/26/2022   CREATININE 1.13 06/26/2022   CALCIUM 9.6 06/26/2022   PROT 7.3 06/26/2022   ALBUMIN 4.6 06/26/2022   AST 23 06/26/2022   ALT 38 06/26/2022   ALKPHOS 55 06/26/2022   BILITOT 1.2 06/26/2022   GFRNONAA >60 06/26/2022   GFRAA >60 06/23/2019    Medications: I have reviewed the patient's current medications.   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  The serum  tumor markers were normal last year.  Will follow-up on the serum tumor markers from today. He would like to continue follow-up in the oncology clinic.  He will return for an office visit in 1 year.  Coni Deep, MD  06/23/2023  12:02 PM

## 2023-06-24 LAB — AFP TUMOR MARKER: AFP, Serum, Tumor Marker: 4.1 ng/mL (ref 0.0–8.4)

## 2023-06-24 LAB — BETA HCG QUANT (REF LAB): hCG Quant: <1 m[IU]/mL (ref 0–3)

## 2023-07-01 ENCOUNTER — Telehealth: Payer: Self-pay

## 2023-07-01 NOTE — Telephone Encounter (Signed)
-----   Message from Coni Deep sent at 06/30/2023  4:04 PM EDT ----- Please call patient, the serum tumor markers are normal, follow-up as scheduled

## 2023-07-01 NOTE — Telephone Encounter (Signed)
 Patient gave verbal understanding and had no further questions or concerns

## 2023-11-03 ENCOUNTER — Telehealth: Payer: Self-pay | Admitting: *Deleted

## 2023-11-03 NOTE — Telephone Encounter (Signed)
 [employer] asking for office to send last 5 years of medical records on patient to fax #[phone]. Left case ID [id number]. Called back and left VM for phone # for [facility] as well as phone # and informed them they will need to send a ROI to them as well.

## 2024-06-22 ENCOUNTER — Ambulatory Visit: Admitting: Oncology

## 2024-06-22 ENCOUNTER — Other Ambulatory Visit
